# Patient Record
Sex: Female | Born: 1935 | Race: White | Hispanic: No | Smoking: Never smoker
Health system: Southern US, Community
[De-identification: ages and names within clinical notes are randomized; demographics above are authoritative.]

## PROBLEM LIST (undated history)

## (undated) DIAGNOSIS — E079 Disorder of thyroid, unspecified: Secondary | ICD-10-CM

## (undated) DIAGNOSIS — I639 Cerebral infarction, unspecified: Secondary | ICD-10-CM

## (undated) DIAGNOSIS — R112 Nausea with vomiting, unspecified: Secondary | ICD-10-CM

## (undated) DIAGNOSIS — I10 Essential (primary) hypertension: Secondary | ICD-10-CM

## (undated) DIAGNOSIS — C801 Malignant (primary) neoplasm, unspecified: Secondary | ICD-10-CM

## (undated) DIAGNOSIS — R011 Cardiac murmur, unspecified: Secondary | ICD-10-CM

## (undated) DIAGNOSIS — E7800 Pure hypercholesterolemia, unspecified: Secondary | ICD-10-CM

## (undated) DIAGNOSIS — Z9889 Other specified postprocedural states: Secondary | ICD-10-CM

## (undated) DIAGNOSIS — E039 Hypothyroidism, unspecified: Secondary | ICD-10-CM

## (undated) DIAGNOSIS — R42 Dizziness and giddiness: Secondary | ICD-10-CM

## (undated) HISTORY — PX: APPENDECTOMY: SHX54

## (undated) HISTORY — PX: EYE SURGERY: SHX253

## (undated) HISTORY — DX: Cardiac murmur, unspecified: R01.1

## (undated) HISTORY — PX: HEMORROIDECTOMY: SUR656

## (undated) HISTORY — DX: Malignant (primary) neoplasm, unspecified: C80.1

## (undated) HISTORY — PX: ABDOMINAL HYSTERECTOMY: SHX81

## (undated) HISTORY — PX: OTHER SURGICAL HISTORY: SHX169

## (undated) HISTORY — PX: CHOLECYSTECTOMY: SHX55

## (undated) HISTORY — PX: MASTECTOMY: SHX3

---

## 2004-08-17 ENCOUNTER — Ambulatory Visit: Payer: Self-pay | Admitting: General Surgery

## 2005-06-21 ENCOUNTER — Ambulatory Visit: Payer: Self-pay | Admitting: Unknown Physician Specialty

## 2005-09-03 ENCOUNTER — Ambulatory Visit: Payer: Self-pay | Admitting: General Surgery

## 2006-11-08 ENCOUNTER — Ambulatory Visit: Payer: Self-pay | Admitting: General Surgery

## 2006-12-26 ENCOUNTER — Ambulatory Visit: Payer: Self-pay | Admitting: Internal Medicine

## 2007-10-13 ENCOUNTER — Ambulatory Visit: Payer: Self-pay | Admitting: Internal Medicine

## 2007-11-10 ENCOUNTER — Ambulatory Visit: Payer: Self-pay | Admitting: Surgery

## 2008-11-15 ENCOUNTER — Ambulatory Visit: Payer: Self-pay | Admitting: Surgery

## 2009-05-16 ENCOUNTER — Emergency Department: Payer: Self-pay | Admitting: Emergency Medicine

## 2009-11-20 ENCOUNTER — Ambulatory Visit: Payer: Self-pay | Admitting: Surgery

## 2010-04-15 ENCOUNTER — Ambulatory Visit: Payer: Self-pay | Admitting: Ophthalmology

## 2010-04-22 ENCOUNTER — Ambulatory Visit: Payer: Self-pay | Admitting: Ophthalmology

## 2010-11-23 ENCOUNTER — Ambulatory Visit: Payer: Self-pay | Admitting: Surgery

## 2011-01-04 ENCOUNTER — Encounter: Payer: Self-pay | Admitting: Internal Medicine

## 2011-01-09 ENCOUNTER — Encounter: Payer: Self-pay | Admitting: Internal Medicine

## 2011-02-09 ENCOUNTER — Encounter: Payer: Self-pay | Admitting: Internal Medicine

## 2011-03-12 ENCOUNTER — Encounter: Payer: Self-pay | Admitting: Internal Medicine

## 2011-04-02 ENCOUNTER — Ambulatory Visit: Payer: Self-pay | Admitting: Neurology

## 2011-04-10 ENCOUNTER — Emergency Department: Payer: Self-pay | Admitting: Emergency Medicine

## 2011-04-21 ENCOUNTER — Other Ambulatory Visit: Payer: Self-pay | Admitting: Neurosurgery

## 2011-04-24 ENCOUNTER — Encounter (HOSPITAL_COMMUNITY): Payer: Self-pay | Admitting: Neurology

## 2011-04-24 ENCOUNTER — Inpatient Hospital Stay (HOSPITAL_COMMUNITY)
Admission: EM | Admit: 2011-04-24 | Discharge: 2011-04-30 | DRG: 057 | Disposition: A | Discharge status: Skilled Nursing Facility | Payer: Medicare Other | Source: Ambulatory Visit | Attending: Neurosurgery | Admitting: Neurosurgery

## 2011-04-24 ENCOUNTER — Other Ambulatory Visit: Payer: Self-pay

## 2011-04-24 DIAGNOSIS — IMO0002 Reserved for concepts with insufficient information to code with codable children: Secondary | ICD-10-CM

## 2011-04-24 DIAGNOSIS — G912 (Idiopathic) normal pressure hydrocephalus: Principal | ICD-10-CM | POA: Diagnosis present

## 2011-04-24 DIAGNOSIS — K5900 Constipation, unspecified: Secondary | ICD-10-CM | POA: Diagnosis present

## 2011-04-24 DIAGNOSIS — E7800 Pure hypercholesterolemia, unspecified: Secondary | ICD-10-CM | POA: Diagnosis present

## 2011-04-24 DIAGNOSIS — E039 Hypothyroidism, unspecified: Secondary | ICD-10-CM | POA: Diagnosis present

## 2011-04-24 DIAGNOSIS — R29898 Other symptoms and signs involving the musculoskeletal system: Secondary | ICD-10-CM | POA: Diagnosis present

## 2011-04-24 DIAGNOSIS — Z79899 Other long term (current) drug therapy: Secondary | ICD-10-CM

## 2011-04-24 DIAGNOSIS — R269 Unspecified abnormalities of gait and mobility: Secondary | ICD-10-CM | POA: Diagnosis present

## 2011-04-24 DIAGNOSIS — I059 Rheumatic mitral valve disease, unspecified: Secondary | ICD-10-CM | POA: Diagnosis present

## 2011-04-24 DIAGNOSIS — R4189 Other symptoms and signs involving cognitive functions and awareness: Secondary | ICD-10-CM | POA: Diagnosis present

## 2011-04-24 DIAGNOSIS — I10 Essential (primary) hypertension: Secondary | ICD-10-CM | POA: Diagnosis present

## 2011-04-24 DIAGNOSIS — N3942 Incontinence without sensory awareness: Secondary | ICD-10-CM | POA: Diagnosis present

## 2011-04-24 DIAGNOSIS — R4689 Other symptoms and signs involving appearance and behavior: Secondary | ICD-10-CM | POA: Diagnosis present

## 2011-04-24 DIAGNOSIS — Z9849 Cataract extraction status, unspecified eye: Secondary | ICD-10-CM

## 2011-04-24 DIAGNOSIS — E785 Hyperlipidemia, unspecified: Secondary | ICD-10-CM | POA: Diagnosis present

## 2011-04-24 DIAGNOSIS — H409 Unspecified glaucoma: Secondary | ICD-10-CM | POA: Diagnosis present

## 2011-04-24 DIAGNOSIS — Z9181 History of falling: Secondary | ICD-10-CM

## 2011-04-24 DIAGNOSIS — G2581 Restless legs syndrome: Secondary | ICD-10-CM | POA: Diagnosis present

## 2011-04-24 HISTORY — DX: Disorder of thyroid, unspecified: E07.9

## 2011-04-24 HISTORY — DX: Dizziness and giddiness: R42

## 2011-04-24 HISTORY — DX: Pure hypercholesterolemia, unspecified: E78.00

## 2011-04-24 HISTORY — DX: Essential (primary) hypertension: I10

## 2011-04-24 LAB — BASIC METABOLIC PANEL
BUN: 13 mg/dL (ref 6–23)
CO2: 26 mEq/L (ref 19–32)
Chloride: 100 mEq/L (ref 96–112)
Creatinine, Ser: 0.54 mg/dL (ref 0.50–1.10)
GFR calc Af Amer: >90 mL/min (ref >90)
Glucose, Bld: 92 mg/dL (ref 70–99)
Potassium: 4.3 mEq/L (ref 3.5–5.1)

## 2011-04-24 LAB — CBC
HCT: 38.8 % (ref 36.0–46.0)
Hemoglobin: 13.7 g/dL (ref 12.0–15.0)
RBC: 4.25 MIL/uL (ref 3.87–5.11)

## 2011-04-24 LAB — DIFFERENTIAL
Lymphs Abs: 1.5 10*3/uL (ref 0.7–4.0)
Monocytes Absolute: 0.7 10*3/uL (ref 0.1–1.0)
Monocytes Relative: 7 % (ref 3–12)
Neutro Abs: 8.1 10*3/uL — ABNORMAL HIGH (ref 1.7–7.7)
Neutrophils Relative %: 77 % (ref 43–77)

## 2011-04-24 LAB — URINALYSIS, ROUTINE W REFLEX MICROSCOPIC
Bilirubin Urine: NEGATIVE
Glucose, UA: NEGATIVE mg/dL
Hgb urine dipstick: NEGATIVE
Nitrite: NEGATIVE
Specific Gravity, Urine: 1.009 (ref 1.005–1.030)
pH: 6 (ref 5.0–8.0)

## 2011-04-24 MED ORDER — POLYETHYLENE GLYCOL 3350 17 G PO PACK
17.0000 g | PACK | Freq: Every day | ORAL | Status: DC | PRN
  Filled 2011-04-24: qty 1

## 2011-04-24 MED ORDER — ONDANSETRON HCL 4 MG PO TABS: 4.0000 mg | ORAL_TABLET | Freq: Four times a day (QID) | ORAL | Status: DC | PRN

## 2011-04-24 MED ORDER — LISINOPRIL 20 MG PO TABS
20.0000 mg | ORAL_TABLET | Freq: Every day | ORAL | Status: DC
  Administered 2011-04-25 – 2011-04-30 (×6): 20 mg via ORAL
  Filled 2011-04-24 (×7): qty 1

## 2011-04-24 MED ORDER — ACETAMINOPHEN 325 MG PO TABS: 650.0000 mg | ORAL_TABLET | Freq: Four times a day (QID) | ORAL | Status: DC | PRN

## 2011-04-24 MED ORDER — TIMOLOL MALEATE 0.25 % OP SOLN
1.0000 [drp] | Freq: Every day | OPHTHALMIC | Status: DC
  Administered 2011-04-25: 1 [drp] via OPHTHALMIC
  Administered 2011-04-26: 2 [drp] via OPHTHALMIC
  Administered 2011-04-27 – 2011-04-28 (×2): 1 [drp] via OPHTHALMIC
  Administered 2011-04-29: 2 [drp] via OPHTHALMIC
  Administered 2011-04-30: 1 [drp] via OPHTHALMIC
  Filled 2011-04-24 (×2): qty 5

## 2011-04-24 MED ORDER — TIMOLOL HEMIHYDRATE 0.25 % OP SOLN: 1.0000 [drp] | Freq: Every day | OPHTHALMIC | Status: DC

## 2011-04-24 MED ORDER — LEVOTHYROXINE SODIUM 75 MCG PO TABS
75.0000 ug | ORAL_TABLET | Freq: Every day | ORAL | Status: DC
  Administered 2011-04-25 – 2011-04-30 (×6): 75 ug via ORAL
  Filled 2011-04-24 (×7): qty 1

## 2011-04-24 MED ORDER — BIMATOPROST 0.03 % OP SOLN
1.0000 [drp] | Freq: Every day | OPHTHALMIC | Status: DC
  Filled 2011-04-24: qty 2.5

## 2011-04-24 MED ORDER — BIMATOPROST 0.01 % OP SOLN
1.0000 [drp] | Freq: Every day | OPHTHALMIC | Status: DC
  Administered 2011-04-24 – 2011-04-29 (×6): 1 [drp] via OPHTHALMIC
  Filled 2011-04-24: qty 2.5

## 2011-04-24 MED ORDER — SODIUM CHLORIDE 0.9 % IV SOLN: INTRAVENOUS | Status: DC

## 2011-04-24 MED ORDER — HYDROCODONE-ACETAMINOPHEN 5-325 MG PO TABS: 1.0000 | ORAL_TABLET | ORAL | Status: DC | PRN

## 2011-04-24 MED ORDER — ZOLPIDEM TARTRATE 5 MG PO TABS: 5.0000 mg | ORAL_TABLET | Freq: Every evening | ORAL | Status: DC | PRN

## 2011-04-24 MED ORDER — LISINOPRIL 20 MG PO TABS
20.0000 mg | ORAL_TABLET | Freq: Every day | ORAL | Status: DC
Start: 2011-04-24 — End: 2011-04-24
  Filled 2011-04-24: qty 1

## 2011-04-24 MED ORDER — ACETAMINOPHEN 650 MG RE SUPP: 650.0000 mg | Freq: Four times a day (QID) | RECTAL | Status: DC | PRN

## 2011-04-24 MED ORDER — DOCUSATE SODIUM 100 MG PO CAPS
100.0000 mg | ORAL_CAPSULE | Freq: Two times a day (BID) | ORAL | Status: DC
  Administered 2011-04-25 – 2011-04-30 (×8): 100 mg via ORAL
  Filled 2011-04-24 (×10): qty 1

## 2011-04-24 MED ORDER — ONDANSETRON HCL 4 MG/2ML IJ SOLN: 4.0000 mg | Freq: Four times a day (QID) | INTRAMUSCULAR | Status: DC | PRN

## 2011-04-24 MED ORDER — ALUM & MAG HYDROXIDE-SIMETH 200-200-20 MG/5ML PO SUSP: 30.0000 mL | Freq: Four times a day (QID) | ORAL | Status: DC | PRN

## 2011-04-24 NOTE — ED Notes (Signed)
Attempt to call report, RN speaking with house coverage regarding pt bed placement

## 2011-04-24 NOTE — ED Notes (Signed)
Pt daughter to bedside, reporting over past 2 weeks pt has had a change in her health beginning with a fall. Pt mobility has decreased to where pt needing walker to ambulate. Pt lived independently prior to fall until now living with daughter. Daughter reporting pt has been having "cognitive slips". Having difficulty with articulation, basic tasks such as writing checks. Pt has been having increasing urinary incontinence over past 2 weeks, bowel incontinence began today. EDP at bedside

## 2011-04-24 NOTE — H&P (Signed)
PCP:   No primary provider on file.   Chief Complaint:  Unsteady gait, weakness, urinary incontinence progressive  HPI: This is a 78 yo woman with PMH sig for HTN and Glaucoma who up until about 6 months ago was fully independent, highly functioning elderly woman-she has had a rapid progression of gait disturbance, urinary incontinence, and cognitive/memory changes. She has been worked up by a neurologist in [place] who was presuming a diagnosis of normal pressure hydrocephalus give triad of symptoms however she has never had an LP done. MRI images done in [place]. She came in with her daughter because the weakness became so bad that she could not be managed at home without full assist and help.  Allergies:  No Known Allergies    Past Medical History  Diagnosis Date  . Hypertension   . Hypercholesteremia   . Thyroid disease   . Vertigo     Past Surgical History  Procedure Date  . Cholecystectomy   . Appendectomy   . Mastectomy   . Abdominal hysterectomy   . Hemorroidectomy     Prior to Admission medications   Medication Sig Start Date End Date Taking? Authorizing Provider  bimatoprost (LUMIGAN) 0.03 % ophthalmic solution Place 1 drop into both eyes at bedtime.   Yes Historical Provider, MD  levothyroxine (SYNTHROID, LEVOTHROID) 75 MCG tablet Take 75 mcg by mouth daily.   Yes Historical Provider, MD  lisinopril (PRINIVIL,ZESTRIL) 20 MG tablet Take 20 mg by mouth daily.   Yes Historical Provider, MD  loratadine (CLARITIN) 10 MG tablet Take 10 mg by mouth daily.   Yes Historical Provider, MD  lovastatin (MEVACOR) 20 MG tablet Take 20 mg by mouth at bedtime.   Yes Historical Provider, MD  timolol (BETIMOL) 0.25 % ophthalmic solution Place 1-2 drops into the right eye daily.   Yes Historical Provider, MD  VITAMIN D, ERGOCALCIFEROL, PO Take 1,000 Units by mouth daily.   Yes Historical Provider, MD    Social History:  reports that she has never smoked. She does not have any  smokeless tobacco history on file. She reports that she does not drink alcohol or use illicit drugs.  No family history on file.  Review of Systems:  Constitutional: Denies fever, chills, diaphoresis, appetite change and fatigue.  HEENT: Denies photophobia, eye pain, redness, hearing loss, ear pain, congestion, sore throat, rhinorrhea, sneezing, mouth sores, trouble swallowing, neck pain, neck stiffness and tinnitus.   Respiratory: Denies SOB, DOE, cough, chest tightness,  and wheezing.   Cardiovascular: Denies chest pain, palpitations and leg swelling.  Gastrointestinal: Denies nausea, vomiting, abdominal pain, diarrhea, constipation, blood in stool and abdominal distention.  Genitourinary: frequency and lack of control of flow Musculoskeletal: Denies myalgias, back pain, joint swelling, arthralgias  Skin: Denies pallor, rash and wound.  Neurological: Denies dizziness, seizures, syncope, weakness, light-headedness, numbness and headaches.  Hematological: Denies adenopathy. Easy bruising, personal or family bleeding history  Psychiatric/Behavioral: Denies suicidal ideation.  Physical Exam: Blood pressure 121/73, pulse 99, temperature 98 F (36.7 C), temperature source Oral, resp. rate 16, weight 64.093 kg (141 lb 4.8 oz), SpO2 93.00%. General appearance: NAD, conversant  Eyes: anicteric sclerae, moist conjunctivae; no lid-lag; PERRLA HENT: Atraumatic; oropharynx clear with moist mucous membranes and no mucosal ulcerations; normal hard and soft palate Neck: Trachea midline; FROM, supple, no thyromegaly or lymphadenopathy Lungs: CTA, with normal respiratory effort and no intercostal retractions CV: RRR, no MRGs  Abdomen: Soft, non-tender; no masses or HSM Extremities: No peripheral edema or extremity lymphadenopathy  Skin: Normal temperature, turgor and texture; no rash, ulcers or subcutaneous nodules Psych: Appropriate affect, alert and oriented to person, place and time Neuro; reflexes  in LE 1+, very unsteady, but strength preserved on flexion/dorsiflexion LE bilaterlally, no muscle atrophy   Labs on Admission:  Results for orders placed during the hospital encounter of 04/24/11 (from the past 48 hour(s))  CBC     Status: Normal   Collection Time   04/24/11  2:22 PM      Component Value Range Comment   WBC 10.5  4.0 - 10.5 (K/uL)    RBC 4.25  3.87 - 5.11 (MIL/uL)    Hemoglobin 13.7  12.0 - 15.0 (g/dL)    HCT 16.1  09.6 - 04.5 (%)    MCV 91.3  78.0 - 100.0 (fL)    MCH 32.2  26.0 - 34.0 (pg)    MCHC 35.3  30.0 - 36.0 (g/dL)    RDW 40.9  81.1 - 91.4 (%)    Platelets 395  150 - 400 (K/uL)   DIFFERENTIAL     Status: Abnormal   Collection Time   04/24/11  2:22 PM      Component Value Range Comment   Neutrophils Relative 77  43 - 77 (%)    Neutro Abs 8.1 (*) 1.7 - 7.7 (K/uL)    Lymphocytes Relative 14  12 - 46 (%)    Lymphs Abs 1.5  0.7 - 4.0 (K/uL)    Monocytes Relative 7  3 - 12 (%)    Monocytes Absolute 0.7  0.1 - 1.0 (K/uL)    Eosinophils Relative 2  0 - 5 (%)    Eosinophils Absolute 0.2  0.0 - 0.7 (K/uL)    Basophils Relative 0  0 - 1 (%)    Basophils Absolute 0.0  0.0 - 0.1 (K/uL)   BASIC METABOLIC PANEL     Status: Abnormal   Collection Time   04/24/11  2:22 PM      Component Value Range Comment   Sodium 136  135 - 145 (mEq/L)    Potassium 4.3  3.5 - 5.1 (mEq/L)    Chloride 100  96 - 112 (mEq/L)    CO2 26  19 - 32 (mEq/L)    Glucose, Bld 92  70 - 99 (mg/dL)    BUN 13  6 - 23 (mg/dL)    Creatinine, Ser 7.82  0.50 - 1.10 (mg/dL)    Calcium 9.5  8.4 - 10.5 (mg/dL)    GFR calc non Af Amer 87 (*) >90 (mL/min)    GFR calc Af Amer >90  >90 (mL/min)   URINALYSIS, ROUTINE W REFLEX MICROSCOPIC     Status: Normal   Collection Time   04/24/11  2:33 PM      Component Value Range Comment   Color, Urine YELLOW  YELLOW     APPearance CLEAR  CLEAR     Specific Gravity, Urine 1.009  1.005 - 1.030     pH 6.0  5.0 - 8.0     Glucose, UA NEGATIVE  NEGATIVE (mg/dL)     Hgb urine dipstick NEGATIVE  NEGATIVE     Bilirubin Urine NEGATIVE  NEGATIVE     Ketones, ur NEGATIVE  NEGATIVE (mg/dL)    Protein, ur NEGATIVE  NEGATIVE (mg/dL)    Urobilinogen, UA 0.2  0.0 - 1.0 (mg/dL)    Nitrite NEGATIVE  NEGATIVE     Leukocytes, UA NEGATIVE  NEGATIVE  MICROSCOPIC NOT DONE ON URINES WITH  NEGATIVE PROTEIN, BLOOD, LEUKOCYTES, NITRITE, OR GLUCOSE <1000 mg/dL.  SEDIMENTATION RATE     Status: Abnormal   Collection Time   04/24/11  6:17 PM      Component Value Range Comment   Sed Rate 26 (*) 0 - 22 (mm/hr)     Radiological Exams on Admission: No results found.  Assessment/Plan Principal Problem:  *Weakness of both legs Active Problems:  NPH (normal pressure hydrocephalus)  Urinary incontinence without sensory awareness  Gait abnormality  Cognitive and behavioral changes  Hypertension  Hyperlipidemia  Glaucoma  1. Weakness/Gait/Incontinence I strongly suspect NPH given gait problems, urinary incontinence, and cognitive changes acute in onset. Need to obtain images from [facility] and neuro records. Will get LP either by IR or procedures team ASAP and get a opening pressure. May need lumbar shunt/drain if elevated. Neuro following.  2. HTN: will resume home medications  3. Glaucoma: resume home eye gtts  4. HLD; I opted to stop her statin, she isn't having myalgias, but since there is LE weakness will hold until further charterized   Time Spent on Admission: 60 min  [facility] Pager: [phone] 04/24/2011, 10:22 PM

## 2011-04-24 NOTE — Progress Notes (Signed)
Linda Davila [id number] Code Status: FULL   Admission Data: 04/24/2011 10:27 PM Attending Provider:  Laqueta Linden. PCP:No primary provider on file. Consults/ Treatment Team:    [name] is a 78 y.o. female patient admitted from ED awake, alert - oriented  X 3 - no acute distress noted.  VSS - Blood pressure 121/73, pulse 99, temperature 98 F (36.7 C), temperature source Oral, resp. rate 16, weight 64.093 kg (141 lb 4.8 oz), SpO2 93.00%.  no c/o shortness of breath, no c/o chest pain.  IV Fluids:  IV in place, occlusive dsg intact without redness, IV cath wrist left, condition patent and no redness normal saline.  Allergies:  No Known Allergies   Past Medical History  Diagnosis Date  . Hypertension   . Hypercholesteremia   . Thyroid disease   . Vertigo    Medications Prior to Admission  Medication Dose Route Frequency Provider Last Rate Last Dose  . 0.9 %  sodium chloride infusion   Intravenous Continuous Edsel Petrin, DO      . acetaminophen (TYLENOL) tablet 650 mg  650 mg Oral Q6H PRN Edsel Petrin, DO       Or  . acetaminophen (TYLENOL) suppository 650 mg  650 mg Rectal Q6H PRN Edsel Petrin, DO      . alum & mag hydroxide-simeth (MAALOX/MYLANTA) 200-200-20 MG/5ML suspension 30 mL  30 mL Oral Q6H PRN Edsel Petrin, DO      . bimatoprost (LUMIGAN) 0.01 % ophthalmic solution 1 drop  1 drop Both Eyes QHS Edsel Petrin, DO   1 drop at 04/24/11 2118  . docusate sodium (COLACE) capsule 100 mg  100 mg Oral BID Edsel Petrin, DO      . HYDROcodone-acetaminophen (NORCO) 5-325 MG per tablet 1-2 tablet  1-2 tablet Oral Q4H PRN Edsel Petrin, DO      . levothyroxine (SYNTHROID, LEVOTHROID) tablet 75 mcg  75 mcg Oral Q0600 Edsel Petrin, DO      . lisinopril (PRINIVIL,ZESTRIL) tablet 20 mg  20 mg Oral Daily Edsel Petrin, DO      . ondansetron [facility]) tablet 4 mg  4 mg Oral Q6H PRN Edsel Petrin, DO       Or  . ondansetron  [facility]) injection 4 mg  4 mg Intravenous Q6H PRN Edsel Petrin, DO      . polyethylene glycol (MIRALAX / GLYCOLAX) packet 17 g  17 g Oral Daily PRN Edsel Petrin, DO      . timolol (TIMOPTIC) 0.25 % ophthalmic solution 1-2 drop  1-2 drop Right Eye Daily Edsel Petrin, DO      . zolpidem (AMBIEN) tablet 5 mg  5 mg Oral QHS PRN Edsel Petrin, DO      . DISCONTD: bimatoprost (LUMIGAN) 0.03 % ophthalmic solution 1 drop  1 drop Both Eyes QHS Edsel Petrin, DO      . DISCONTD: lisinopril (PRINIVIL,ZESTRIL) tablet 20 mg  20 mg Oral Daily Edsel Petrin, DO      . DISCONTD: timolol (BETIMOL) 0.25 % ophthalmic solution 1-2 drop  1-2 drop Right Eye Daily Edsel Petrin, DO       No current outpatient prescriptions on file as of 04/24/2011.   History:  obtained from the patient. Tobacco/alcohol: denied none  Orientation to room, and floor completed with information packet given to patient/family.  Patient declined safety video at this time.  Admission INP armband ID verified with patient/family,  and in place.   SR up x 2, fall assessment complete, with patient and family able to verbalize understanding of risk associated with falls, and verbalized understanding to call nsg before up out of bed.  Call light within reach, patient able to voice, and demonstrate understanding.  Skin, clean-dry- intact without evidence of bruising, or skin tears.   No evidence of skin break down noted on exam.     Will cont to eval and treat per MD orders.  [name], RN 04/24/2011 10:27 PM

## 2011-04-24 NOTE — Consult Note (Signed)
Reason for Consult: "normal pressure hydrocephalus"  HPI: Linda Davila is an 78 y.o. female who was sent in for an evaluation of normal pressure hydrocephalus. Per report, she had a normal outside MRI that was consistent with NPH. The images are not available for review at this time. She has had 3 falls in the last 2 weeks and has had some urinary frequency. Overall, she has had trouble with gait for the last 6 months.   Past Medical History  Diagnosis Date  . Hypertension   . Hypercholesteremia   . Thyroid disease   . Vertigo    Medications: I have reviewed the patient's current medications.  Past Surgical History  Procedure Date  . Cholecystectomy   . Appendectomy   . Mastectomy   . Abdominal hysterectomy   . Hemorroidectomy    No family history on file.  Social History:  reports that she has never smoked. She does not have any smokeless tobacco history on file. She reports that she does not drink alcohol or use illicit drugs.  Allergies: No Known Allergies  ROS: as above  Blood pressure 155/81, pulse 83, temperature 98.3 F (36.8 C), temperature source Oral, resp. rate 15, SpO2 98.00%.  Neurological exam: AAO*3. No aphasia.  Was able to tell me months of the year forwards and backwards correctly, exhibiting good attention span. Recall was 3 of 3 after 5 minutes. Followed complex commands. Cranial nerves: EOMI, PERRL. Visual fields were full. Sensation to V1 through V3 areas of the face was intact and symmetric throughout. There was no facial asymmetry. Hearing to finger rub was equal and symmetrical bilaterally. Shoulder shrug was 5/5 and symmetric bilaterally. Head rotation was 5/5 bilaterally. There was no dysarthria or palatal deviation. Motor: strength was 5/5 and symmetric throughout. Sensory: was intact throughout to light touch, pinprick. Coordination: finger-to-nose were intact and symmetric bilaterally. Reflexes: were 2+ in upper extremities and 1+ at the knees and 1+ at  the ankles. Plantar response was downgoing bilaterally. Gait: Romberg test was positive. Not able to stand without support and would fall backwards.   Results for orders placed during the hospital encounter of 04/24/11 (from the past 48 hour(s))  CBC     Status: Normal   Collection Time   04/24/11  2:22 PM      Component Value Range Comment   WBC 10.5  4.0 - 10.5 (K/uL)    RBC 4.25  3.87 - 5.11 (MIL/uL)    Hemoglobin 13.7  12.0 - 15.0 (g/dL)    HCT 09.8  11.9 - 14.7 (%)    MCV 91.3  78.0 - 100.0 (fL)    MCH 32.2  26.0 - 34.0 (pg)    MCHC 35.3  30.0 - 36.0 (g/dL)    RDW 82.9  56.2 - 13.0 (%)    Platelets 395  150 - 400 (K/uL)   DIFFERENTIAL     Status: Abnormal   Collection Time   04/24/11  2:22 PM      Component Value Range Comment   Neutrophils Relative 77  43 - 77 (%)    Neutro Abs 8.1 (*) 1.7 - 7.7 (K/uL)    Lymphocytes Relative 14  12 - 46 (%)    Lymphs Abs 1.5  0.7 - 4.0 (K/uL)    Monocytes Relative 7  3 - 12 (%)    Monocytes Absolute 0.7  0.1 - 1.0 (K/uL)    Eosinophils Relative 2  0 - 5 (%)    Eosinophils Absolute 0.2  0.0 - 0.7 (K/uL)    Basophils Relative 0  0 - 1 (%)    Basophils Absolute 0.0  0.0 - 0.1 (K/uL)   BASIC METABOLIC PANEL     Status: Abnormal   Collection Time   04/24/11  2:22 PM      Component Value Range Comment   Sodium 136  135 - 145 (mEq/L)    Potassium 4.3  3.5 - 5.1 (mEq/L)    Chloride 100  96 - 112 (mEq/L)    CO2 26  19 - 32 (mEq/L)    Glucose, Bld 92  70 - 99 (mg/dL)    BUN 13  6 - 23 (mg/dL)    Creatinine, Ser 1.61  0.50 - 1.10 (mg/dL)    Calcium 9.5  8.4 - 10.5 (mg/dL)    GFR calc non Af Amer 87 (*) >90 (mL/min)    GFR calc Af Amer >90  >90 (mL/min)   URINALYSIS, ROUTINE W REFLEX MICROSCOPIC     Status: Normal   Collection Time   04/24/11  2:33 PM      Component Value Range Comment   Color, Urine YELLOW  YELLOW     APPearance CLEAR  CLEAR     Specific Gravity, Urine 1.009  1.005 - 1.030     pH 6.0  5.0 - 8.0     Glucose, UA NEGATIVE   NEGATIVE (mg/dL)    Hgb urine dipstick NEGATIVE  NEGATIVE     Bilirubin Urine NEGATIVE  NEGATIVE     Ketones, ur NEGATIVE  NEGATIVE (mg/dL)    Protein, ur NEGATIVE  NEGATIVE (mg/dL)    Urobilinogen, UA 0.2  0.0 - 1.0 (mg/dL)    Nitrite NEGATIVE  NEGATIVE     Leukocytes, UA NEGATIVE  NEGATIVE  MICROSCOPIC NOT DONE ON URINES WITH NEGATIVE PROTEIN, BLOOD, LEUKOCYTES, NITRITE, OR GLUCOSE <1000 mg/dL.   Assessment/Plan: 78 years old woman with suspected normal pressure hydrocephalus and multiple recent falls who was sent in by her neurologist for an evaluation 1) Recommend obtaining the films done at her neurologist's office and reviewing them with radiology to confirm the diagnosis 2) Can LP for opening pressure as well as cell count, WBC and RBC counts  Linda Davila 04/24/2011, 5:08 PM

## 2011-04-24 NOTE — ED Notes (Signed)
Per ems- ems called out today for new bowel incontinence and weakness. PT being evaluated on Friday for NPH, getting spinal tap on Friday for further investigation. Denying any pain. A & O x 4. Pt denying any vomiting. 164/102, 92 SR. NAD

## 2011-04-24 NOTE — ED Notes (Signed)
Pt reporting a fall 2 weeks ago, since then been feeling weak and having to walk with walker vs just using a cane.

## 2011-04-24 NOTE — ED Provider Notes (Addendum)
History     CSN: [id number]  Arrival date & time 04/24/11  1247   First MD Initiated Contact with Patient 04/24/11 1251      Chief Complaint  Patient presents with  . Weakness  . Encopresis    (Consider location/radiation/quality/duration/timing/severity/associated sxs/prior treatment) Patient is a 78 y.o. female presenting with weakness. The history is provided by the patient and a relative.  Weakness Primary symptoms do not include headaches, fever, nausea or vomiting.  Additional symptoms include weakness.   the patient is a 78 year old, female, who has normal pressure hydrocephalus, who presents to emergency department after she had an episode of weakness in her legs, which cause her fall onto the toilet.  She was taking a shower, and tried to get out.  Her legs became weak, and she collapsed onto the toilet where she had a loose stool.  Following that.  She called the medics and was brought to the emergency department for evaluation.  She has a neurologist in [place], who has been evaluating her for frequent falls and confusion.  MRI has been performed, and she has been diagnosed with a normal pressure hydrocephalus.  She has also been evaluated by Dr. Georgia Duff the neurosurgeon.  She denies pain at this time.  She denies recent illness.  She has not had nausea, vomiting, fevers, chills, or urinary tract symptoms.  She denies a history of heart attack or stroke.  She does not have diabetes.  She does not cigarettes.  Past Medical History  Diagnosis Date  . Hypertension   . Hypercholesteremia   . Thyroid disease   . Vertigo     Past Surgical History  Procedure Date  . Cholecystectomy   . Appendectomy   . Mastectomy   . Abdominal hysterectomy   . Hemorroidectomy     No family history on file.  History  Substance Use Topics  . Smoking status: Never Smoker   . Smokeless tobacco: Not on file  . Alcohol Use: No    OB History    Grav Para Term Preterm Abortions TAB  SAB Ect Mult Living                  Review of Systems  Constitutional: Negative for fever and chills.  Eyes: Negative for visual disturbance.  Respiratory: Negative for cough, chest tightness and shortness of breath.   Cardiovascular: Negative for chest pain and leg swelling.  Gastrointestinal: Positive for diarrhea. Negative for nausea, vomiting and abdominal pain.  Genitourinary: Negative for dysuria.  Musculoskeletal: Negative for back pain.  Neurological: Positive for weakness. Negative for syncope and headaches.  Psychiatric/Behavioral: Negative for confusion.  All other systems reviewed and are negative.    Allergies  Review of patient's allergies indicates no known allergies.  Home Medications   Current Outpatient Rx  Name Route Sig Dispense Refill  . BIMATOPROST 0.03 % OP SOLN Both Eyes Place 1 drop into both eyes at bedtime.    Marland Kitchen LEVOTHYROXINE SODIUM 75 MCG PO TABS Oral Take 75 mcg by mouth daily.    Marland Kitchen LISINOPRIL 20 MG PO TABS Oral Take 20 mg by mouth daily.    Marland Kitchen LORATADINE 10 MG PO TABS Oral Take 10 mg by mouth daily.    Marland Kitchen LOVASTATIN 20 MG PO TABS Oral Take 20 mg by mouth at bedtime.    Marland Kitchen TIMOLOL HEMIHYDRATE 0.25 % OP SOLN Right Eye Place 1-2 drops into the right eye daily.    Marland Kitchen VITAMIN D (ERGOCALCIFEROL) PO Oral  Take 1,000 Units by mouth daily.      BP 159/78  Pulse 91  Temp(Src) 98.3 F (36.8 C) (Oral)  Resp 23  SpO2 90%  Physical Exam  Vitals reviewed. Constitutional: She is oriented to person, place, and time. She appears well-developed and well-nourished.  HENT:  Head: Normocephalic and atraumatic.  Eyes: Pupils are equal, round, and reactive to light.  Neck: Normal range of motion.  Cardiovascular: Normal rate, regular rhythm and normal heart sounds.   No murmur heard. Pulmonary/Chest: Effort normal and breath sounds normal. No respiratory distress. She has no wheezes. She has no rales.  Abdominal: Soft. She exhibits no distension and no mass.  There is no tenderness. There is no rebound and no guarding.  Genitourinary:       Normal rectal tone.  Rectal examination was performed with a nurse chaperone  Musculoskeletal: Normal range of motion. She exhibits no edema and no tenderness.  Neurological: She is alert and oriented to person, place, and time. She displays normal reflexes. No cranial nerve deficit. She exhibits normal muscle tone.  Skin: Skin is warm and dry. No rash noted. No erythema.  Psychiatric: She has a normal mood and affect. Her behavior is normal.    ED Course  Procedures (including critical care time)   Labs Reviewed  CBC  DIFFERENTIAL  BASIC METABOLIC PANEL  URINALYSIS, ROUTINE W REFLEX MICROSCOPIC   No results found.   No diagnosis found.  3:39 PM Spoke with Dr. Lyman Speller.  Neurology. He will consult. Spoke with teaching service. They will admit  ED ECG REPORT   Date: 04/24/2011  EKG Time: 3:42 PM  Rate: 92  Rhythm: normal sinus rhythm,    Axis: left  Intervals:left anterior fascicular block and incomplete RBBB  ST&T Change: nonspecific  Narrative Interpretation: nsr with incomplete rbbb and lafb             MDM  NPH. Recurrent falls.        Cheri Guppy, MD 04/24/11 1540  Cheri Guppy, MD 04/24/11 1544

## 2011-04-24 NOTE — ED Notes (Signed)
Admitting at bedside 

## 2011-04-25 ENCOUNTER — Inpatient Hospital Stay (HOSPITAL_COMMUNITY): Payer: Medicare Other

## 2011-04-25 LAB — COMPREHENSIVE METABOLIC PANEL
Albumin: 2.9 g/dL — ABNORMAL LOW (ref 3.5–5.2)
BUN: 12 mg/dL (ref 6–23)
Chloride: 102 mEq/L (ref 96–112)
Creatinine, Ser: 0.56 mg/dL (ref 0.50–1.10)
Total Bilirubin: 0.4 mg/dL (ref 0.3–1.2)

## 2011-04-25 LAB — CBC
HCT: 36.8 % (ref 36.0–46.0)
MCH: 30.9 pg (ref 26.0–34.0)
MCHC: 34 g/dL (ref 30.0–36.0)
MCV: 90.9 fL (ref 78.0–100.0)
RDW: 12.9 % (ref 11.5–15.5)
WBC: 9.1 10*3/uL (ref 4.0–10.5)

## 2011-04-25 LAB — VITAMIN B12: Vitamin B-12: 1014 pg/mL — ABNORMAL HIGH (ref 211–911)

## 2011-04-25 LAB — PROTIME-INR
INR: 0.98 (ref 0.00–1.49)
Prothrombin Time: 13.2 seconds (ref 11.6–15.2)

## 2011-04-25 MED ORDER — SIMVASTATIN 10 MG PO TABS
10.0000 mg | ORAL_TABLET | Freq: Every day | ORAL | Status: DC
  Administered 2011-04-25 – 2011-04-29 (×5): 10 mg via ORAL
  Filled 2011-04-25 (×7): qty 1

## 2011-04-25 NOTE — Progress Notes (Signed)
PATIENT DETAILS Name: Aubriana Eustice Age: 78 y.o. Sex: female Date of Birth: 06/06/1930 Admit Date: 04/24/2011 PCP:No primary provider on file.  Subjective: No major issues overnight. Unsteady Gait/Falls/Incontinence/cognitive disturbances are going on for at least 6 months, admitted for worsening gait disturbance causing falls. Has been seen by Neurologist in [place]-Dr Shah-thought to have NPH-has been seen by Dr Lajuana Matte consideration of drain, but has not had a LP yet.  Objective: Vital signs in last 24 hours: Filed Vitals:   04/24/11 1645 04/24/11 1830 04/24/11 2219 04/25/11 0554  BP: 155/81 158/74 121/73 118/70  Pulse: 83 86 99 83  Temp:   98 F (36.7 C) 98.4 F (36.9 C)  TempSrc:   Oral Oral  Resp: 15 16 16 16   Height:   5\' 6"  (1.676 m)   Weight:   64.093 kg (141 lb 4.8 oz)   SpO2: 98% 98% 93% 96%    Weight change:   Body mass index is 22.81 kg/(m^2).  Intake/Output from previous day:  Intake/Output Summary (Last 24 hours) at 04/25/11 1021 Last data filed at 04/25/11 0500  Gross per 24 hour  Intake    750 ml  Output      0 ml  Net    750 ml    PHYSICAL EXAM: Gen Exam: Awake and alert with clear speech.  Neck: Supple, No JVD.   Chest: B/L Clear.   CVS: S1 S2 Regular, no murmurs. Abdomen: soft, BS +, non tender, non distended.  Extremities: no edema, lower extremities warm to touch Neurologic: Non Focal.   Skin: No Rash.   Wounds: N/A.    CONSULTS:  neurology  LAB RESULTS: CBC  Lab 04/25/11 0558 04/24/11 1422  WBC 9.1 10.5  HGB 12.5 13.7  HCT 36.8 38.8  PLT 385 395  MCV 90.9 91.3  MCH 30.9 32.2  MCHC 34.0 35.3  RDW 12.9 13.0  LYMPHSABS -- 1.5  MONOABS -- 0.7  EOSABS -- 0.2  BASOSABS -- 0.0  BANDABS -- --    Chemistries   Lab 04/25/11 0558 04/24/11 1422  NA 136 136  K 4.0 4.3  CL 102 100  CO2 25 26  GLUCOSE 85 92  BUN 12 13  CREATININE 0.56 0.54  CALCIUM 9.0 9.5  MG -- --    GFR Estimated Creatinine Clearance: 53.4 ml/min  (by C-G formula based on Cr of 0.56).  Coagulation profile No results found for this basename: INR:5,PROTIME:5 in the last 168 hours  Cardiac Enzymes No results found for this basename: CK:3,CKMB:3,TROPONINI:3,MYOGLOBIN:3 in the last 168 hours  No components found with this basename: POCBNP:3 No results found for this basename: DDIMER:2 in the last 72 hours No results found for this basename: HGBA1C:2 in the last 72 hours No results found for this basename: CHOL:2,HDL:2,LDLCALC:2,TRIG:2,CHOLHDL:2,LDLDIRECT:2 in the last 72 hours  Basename 04/24/11 1817  TSH 5.559*  T4TOTAL --  T3FREE --  Marko Plume --    Basename 04/24/11 1817  VITAMINB12 1014*  FOLATE --  FERRITIN --  TIBC --  IRON --  RETICCTPCT --   No results found for this basename: LIPASE:2,AMYLASE:2 in the last 72 hours  Urine Studies No results found for this basename: UACOL:2,UAPR:2,USPG:2,UPH:2,UTP:2,UGL:2,UKET:2,UBIL:2,UHGB:2,UNIT:2,UROB:2,ULEU:2,UEPI:2,UWBC:2,URBC:2,UBAC:2,CAST:2,CRYS:2,UCOM:2,BILUA:2 in the last 72 hours  MICROBIOLOGY: No results found for this or any previous visit (from the past 240 hour(s)).  RADIOLOGY STUDIES/RESULTS: No results found.  MEDICATIONS: Scheduled Meds:   . bimatoprost  1 drop Both Eyes QHS  . docusate sodium  100 mg Oral BID  . levothyroxine  75 mcg Oral Q0600  . lisinopril  20 mg Oral Daily  . timolol  1-2 drop Right Eye Daily  . DISCONTD: bimatoprost  1 drop Both Eyes QHS  . DISCONTD: lisinopril  20 mg Oral Daily  . DISCONTD: timolol  1-2 drop Right Eye Daily   Continuous Infusions:   . DISCONTD: sodium chloride     PRN Meds:.acetaminophen, acetaminophen, alum & mag hydroxide-simeth, HYDROcodone-acetaminophen, ondansetron (ZOFRAN) IV, ondansetron, polyethylene glycol, zolpidem  Antibiotics: Anti-infectives    None      Assessment/Plan: Patient Active Hospital Problem List:  Lower Ext Weakness/Incontinence/Cognitive Dysfunction -suspicion for Normal  Pressure Hydrocephalus -worsening weakness, causing numerous falls recently, now not able to ambulate safely at all -has been evaluation by Neuro as outpatient-referred to Dr Venetia Maxon for drain placement-but has not had a LP yet -for now-will try to get a LP with documentation of opening pressure -will attempt to get radiologic studies from primary neurologist -get PT eval  HTN -controlled -continue with Lisinopril  Dyslipidemia -resume Statin  Hypothyroidism -continue with Levothyroxine  Glaucoma -Continue with Lumigan and Timolol  Disposition: -remain inpatient  DVT Prophylaxis: -B/L SCD's, start prophylactic lovenox once LP done  Code Status: Full Code  Maretta Bees,  MD. 04/25/2011, 10:21 AM

## 2011-04-26 ENCOUNTER — Inpatient Hospital Stay (HOSPITAL_COMMUNITY): Payer: Medicare Other

## 2011-04-26 LAB — IRON AND TIBC
Iron: 70 ug/dL (ref 42–135)
Saturation Ratios: 28 % (ref 20–55)
Saturation Ratios: 25 % (ref 20–55)

## 2011-04-26 LAB — RETICULOCYTES: Retic Count, Absolute: 52.8 10*3/uL (ref 19.0–186.0)

## 2011-04-26 NOTE — Progress Notes (Signed)
PATIENT DETAILS Name: Linda Davila Age: 78 y.o. Sex: female Date of Birth: 1930/12/30 Admit Date: 04/24/2011 PCP:No primary provider on file.  Subjective: No major issues overnight.   Objective: Vital signs in last 24 hours: Filed Vitals:   04/25/11 1500 04/25/11 2207 04/26/11 0435 04/26/11 1305  BP: 120/77 107/67 116/75 122/75  Pulse: 99 91 87 108  Temp: 97.9 F (36.6 C) 98.4 F (36.9 C) 98 F (36.7 C) 98.2 F (36.8 C)  TempSrc: Oral Oral Oral   Resp: 18 17 18 19   Height:      Weight:      SpO2: 94% 94% 96% 94%    Weight change:   Body mass index is 22.81 kg/(m^2).  Intake/Output from previous day:  Intake/Output Summary (Last 24 hours) at 04/26/11 1423 Last data filed at 04/26/11 0600  Gross per 24 hour  Intake    240 ml  Output      2 ml  Net    238 ml    PHYSICAL EXAM: Gen Exam: Awake and alert with clear speech.  Neck: Supple, No JVD.   Chest: B/L Clear.   CVS: S1 S2 Regular, no murmurs. Abdomen: soft, BS +, non tender, non distended.  Extremities: no edema, lower extremities warm to touch Neurologic: Non Focal.   Skin: No Rash.   Wounds: N/A.    CONSULTS:  neurology  LAB RESULTS: CBC  Lab 04/25/11 0558 04/24/11 1422  WBC 9.1 10.5  HGB 12.5 13.7  HCT 36.8 38.8  PLT 385 395  MCV 90.9 91.3  MCH 30.9 32.2  MCHC 34.0 35.3  RDW 12.9 13.0  LYMPHSABS -- 1.5  MONOABS -- 0.7  EOSABS -- 0.2  BASOSABS -- 0.0  BANDABS -- --    Chemistries   Lab 04/25/11 0558 04/24/11 1422  NA 136 136  K 4.0 4.3  CL 102 100  CO2 25 26  GLUCOSE 85 92  BUN 12 13  CREATININE 0.56 0.54  CALCIUM 9.0 9.5  MG -- --    GFR Estimated Creatinine Clearance: 53.4 ml/min (by C-G formula based on Cr of 0.56).  Coagulation profile  Lab 04/25/11 1003  INR 0.98  PROTIME --    Cardiac Enzymes No results found for this basename: CK:3,CKMB:3,TROPONINI:3,MYOGLOBIN:3 in the last 168 hours  No components found with this basename: POCBNP:3 No results found for  this basename: DDIMER:2 in the last 72 hours No results found for this basename: HGBA1C:2 in the last 72 hours No results found for this basename: CHOL:2,HDL:2,LDLCALC:2,TRIG:2,CHOLHDL:2,LDLDIRECT:2 in the last 72 hours  Basename 04/24/11 1817  TSH 5.559*  T4TOTAL --  T3FREE --  Marko Plume --    Basename 04/24/11 1817  VITAMINB12 1014*  FOLATE --  FERRITIN --  TIBC --  IRON --  RETICCTPCT --   No results found for this basename: LIPASE:2,AMYLASE:2 in the last 72 hours  Urine Studies No results found for this basename: UACOL:2,UAPR:2,USPG:2,UPH:2,UTP:2,UGL:2,UKET:2,UBIL:2,UHGB:2,UNIT:2,UROB:2,ULEU:2,UEPI:2,UWBC:2,URBC:2,UBAC:2,CAST:2,CRYS:2,UCOM:2,BILUA:2 in the last 72 hours  MICROBIOLOGY: No results found for this or any previous visit (from the past 240 hour(s)).  RADIOLOGY STUDIES/RESULTS: No results found.  MEDICATIONS: Scheduled Meds:    . bimatoprost  1 drop Both Eyes QHS  . docusate sodium  100 mg Oral BID  . levothyroxine  75 mcg Oral Q0600  . lisinopril  20 mg Oral Daily  . simvastatin  10 mg Oral q1800  . timolol  1-2 drop Right Eye Daily   Continuous Infusions:  PRN Meds:.acetaminophen, acetaminophen, alum & mag hydroxide-simeth, HYDROcodone-acetaminophen, ondansetron (ZOFRAN)  IV, ondansetron, polyethylene glycol, zolpidem  Antibiotics: Anti-infectives    None      Assessment/Plan: Patient Active Hospital Problem List:  Lower Ext Weakness/Incontinence/Cognitive Dysfunction -suspicion for Normal Pressure Hydrocephalus -LP  so today, apparently will be performed by Dr. Venetia Maxon in the next few days  Restless leg syndrome -Now on Requip -We'll check iron panel  HTN -controlled -continue with Lisinopril  Dyslipidemia -resume Statin  Hypothyroidism -continue with Levothyroxine  Glaucoma -Continue with Lumigan and Timolol  Disposition: -remain inpatient  DVT Prophylaxis: -B/L SCD's, start prophylactic lovenox once LP done  Code  Status: Full Code  Maretta Bees,  MD. 04/26/2011, 2:23 PM

## 2011-04-26 NOTE — Evaluation (Signed)
Physical Therapy Evaluation Patient Details Name: Linda Davila MRN: [id number] DOB: 10-Apr-1930 Today's Date: 04/26/2011  Problem List:  Patient Active Problem List  Diagnoses  . Weakness of both legs  . NPH (normal pressure hydrocephalus)  . Urinary incontinence without sensory awareness  . Gait abnormality  . Cognitive and behavioral changes  . Hypertension  . Hyperlipidemia  . Glaucoma    Past Medical History:  Past Medical History  Diagnosis Date  . Hypertension   . Hypercholesteremia   . Thyroid disease   . Vertigo    Past Surgical History:  Past Surgical History  Procedure Date  . Cholecystectomy   . Appendectomy   . Mastectomy   . Abdominal hysterectomy   . Hemorroidectomy     PT Assessment/Plan/Recommendation PT Assessment Clinical Impression Statement: Pt adm after fall.  Pt with ? normal pressure hydrocephalus and to have lumbar puncture soon (was cancelled for today).  I performed the pre lumbar puncture assessment today and it will be in shadow chart.  Feel pt will need ST-SNF at dc. PT Recommendation/Assessment: Patient will need skilled PT in the acute care venue PT Problem List: Decreased strength;Decreased knowledge of use of DME;Decreased balance;Decreased mobility PT Plan PT Frequency: Min 3X/week PT Treatment/Interventions: Gait training;DME instruction;Functional mobility training;Patient/family education;Therapeutic activities;Therapeutic exercise;Balance training PT Goals  Acute Rehab PT Goals PT Goal Formulation: With patient Time For Goal Achievement: 7 days Pt will go Sit to Stand: with supervision PT Goal: Sit to Stand - Progress: Goal set today Pt will go Stand to Sit: with supervision PT Goal: Stand to Sit - Progress: Goal set today Pt will Ambulate: 51 - 150 feet;with supervision PT Goal: Ambulate - Progress: Goal set today  PT Evaluation Precautions/Restrictions  Precautions Precautions: Fall Prior Functioning  Home Living Lives  With: Daughter (moved in with daughter 2 weeks ago due to decline) Receives Help From: Family Type of Home: House Home Layout: One level Home Access: Stairs to enter Entrance Stairs-Rails: Right Entrance Stairs-Number of Steps: 3 Home Adaptive Equipment: Walker - rolling Additional Comments: Pt and daughter do not feel pt is safe to return to daughter's home due to falls. Prior Function Level of Independence: Requires assistive device for independence;Independent with gait;Independent with transfers (with frequent falls) Driving: No (not recently) Cognition Cognition Arousal/Alertness: Awake/alert Overall Cognitive Status: Appears within functional limits for tasks assessed Orientation Level: Oriented X4 Sensation/Coordination   Extremity Assessment   Mobility (including Balance) Bed Mobility Bed Mobility: Yes Supine to Sit: 6: Modified independent (Device/Increase time);With rails Sitting - Scoot to Edge of Bed: 6: Modified independent (Device/Increase time) Sit to Supine: With rail;6: Modified independent (Device/Increase time) Transfers Sit to Stand: 4: Min assist;With upper extremity assist;From bed;From chair/3-in-1;With armrests Sit to Stand Details (indicate cue type and reason): Cues for hand placement Stand to Sit: 4: Min assist;With upper extremity assist;With armrests;To bed;To chair/3-in-1 Stand to Sit Details: verbal cues for hand placement and assist to control descent Ambulation/Gait Ambulation/Gait Assistance: 4: Min assist Ambulation/Gait Assistance Details (indicate cue type and reason): Assist to prevent posterior lean Ambulation Distance (Feet): 100 Feet Assistive device: Rolling walker Gait Pattern: Shuffle;Trunk flexed Gait velocity: 1.06 ft/sec    Exercise    End of Session PT - End of Session Equipment Utilized During Treatment: Gait belt Activity Tolerance: Patient tolerated treatment well Patient left: in bed Nurse Communication: Mobility status  for ambulation General Behavior During Session: [facility] for tasks performed Cognition: [facility] for tasks performed  [facility] 04/26/2011, 1:50 PM  [employer]  PT [phone]

## 2011-04-26 NOTE — Progress Notes (Signed)
   CARE MANAGEMENT NOTE 04/26/2011  Patient:  Linda Davila, Linda Davila   Account Number:  [id number]  Date Initiated:  04/26/2011  Documentation initiated by:  Letha Cape  Subjective/Objective Assessment:   dx weakness of legs  admit- lives alone, had some falls, stayed with daughter-having falls.     Action/Plan:   pt eval- rec st snf.  Neuro consulted.   Anticipated DC Date:  04/30/2011   Anticipated DC Plan:  SKILLED NURSING FACILITY  In-house referral  Clinical Social Worker      DC Planning Services  CM consult      Choice offered to / List presented to:             Status of service:  In process, will continue to follow Medicare Important Message given?   (If response is "NO", the following Medicare IM given date fields will be blank) Date Medicare IM given:   Date Additional Medicare IM given:    Discharge Disposition:    Per UR Regulation:    If discussed at Long Length of Stay Meetings, dates discussed:    Comments:  04/26/11 16:16 Letha Cape RN, BSN [phone] patient lives alone,  had some falls at home, went to stay with daughter- had falls at daughters as well , per physical therapy eval recs are for st snf.  CSW referral.

## 2011-04-26 NOTE — Progress Notes (Signed)
Utilization review completed.  

## 2011-04-26 NOTE — Progress Notes (Signed)
Patient ID: Linda Davila, female   DOB: Dec 27, 1930, 78 y.o.   MRN: [id number]  Received call from CT to check status/plan re: LP.  Pt was apparently admitted through the ER over the weekend d/y gait issues and falls and has been referred for a head CT & LP to evaluate NPH.  The CT was ordered as a pre-LP study, not to evaluate for head trauma.  With this information, and the patient's report of no head injury per CT staff, Dr. Venetia Maxon recommends cancelling the CT & LP scheduled for today.  He will plan to see the patient as a consult 3/19 am.   Prior to this admission, she had been scheduled for an LP on 04-30-11 with Dr. Venetia Maxon to further evaluate her suspected NPH Dx.  This will now be done on an inpatient basis by Dr. Venetia Maxon, earlier than Friday if at all possible.  Oris Drone Tavin Vernet, RN, BSN

## 2011-04-26 NOTE — Consult Note (Signed)
Subjective: Interval History: none.  Objective: Vital signs in last 24 hours: Temp:  [97.9 F (36.6 C)-98.4 F (36.9 C)] 98.2 F (36.8 C) (03/18 1305) Pulse Rate:  [87-108] 108  (03/18 1305) Resp:  [17-19] 19  (03/18 1305) BP: (107-122)/(67-77) 122/75 mmHg (03/18 1305) SpO2:  [94 %-96 %] 94 % (03/18 1305)  Intake/Output from previous day: 03/17 0701 - 03/18 0700 In: 480 [P.O.:480] Out: 2 [Urine:2]   Nutritional status: General  Neurological exam: AAO*3. No aphasia. Was able to tell me months of the year forwards and backwards correctly, exhibiting good attention span. Recall was 3 of 3 after 5 minutes. Followed complex commands. Cranial nerves: EOMI, PERRL. Visual fields were full. Sensation to V1 through V3 areas of the face was intact and symmetric throughout. There was no facial asymmetry. Hearing to finger rub was equal and symmetrical bilaterally. Shoulder shrug was 5/5 and symmetric bilaterally. Head rotation was 5/5 bilaterally. There was no dysarthria or palatal deviation. Motor: strength was 5/5 and symmetric throughout. Sensory: was intact throughout to light touch, pinprick. Coordination: finger-to-nose were intact and symmetric bilaterally. Reflexes: were 2+ in upper extremities and 1+ at the knees and 1+ at the ankles. Plantar response was downgoing bilaterally. Gait: Romberg test was positive. Not able to stand without support and would fall backwards.   Lab Results:  Basename 04/25/11 0558 04/24/11 1422  WBC 9.1 10.5  HGB 12.5 13.7  HCT 36.8 38.8  PLT 385 395  NA 136 136  K 4.0 4.3  CL 102 100  CO2 25 26  GLUCOSE 85 92  BUN 12 13  CREATININE 0.56 0.54  CALCIUM 9.0 9.5  LABA1C -- --   Medications: I have reviewed the patient's current medications.  Assessment/Plan: 78 years old woman with MRI consistent with NPH - Dr. Venetia Maxon will perform an LP himself Patient complains of symptoms of RLS - sent of iron studies. If iron is low, it should be repleted.  Start  Requip for RLS: 0.25 mg once daily 1-3 hours before bedtime. Dose may be increased after 2 days to 0.5 mg daily, and after 7 days to 1 mg daily. Dose may be further titrated upward in 0.5 mg increments every week until reaching a daily dose of 3 mg during week 6. If symptoms persist or reappear, the daily dose may be increased to a maximum of 4 mg beginning week 7. Call with questions.   LOS: 2 days   Catheryn Slifer

## 2011-04-27 LAB — GRAM STAIN: Special Requests: NORMAL

## 2011-04-27 LAB — CSF CELL COUNT WITH DIFFERENTIAL
RBC Count, CSF: 92 /mm3 — ABNORMAL HIGH
Tube #: 3

## 2011-04-27 NOTE — Progress Notes (Signed)
PATIENT DETAILS Name: Linda Davila Age: 78 y.o. Sex: female Date of Birth: [birth year] Admit Date: 04/24/2011 PCP:No primary provider on file.  Subjective: No major issues overnight- for LP today   Objective: Vital signs in last 24 hours: Filed Vitals:   04/26/11 0435 04/26/11 1305 04/26/11 2200 04/27/11 0539  BP: 116/75 122/75 101/68 133/84  Pulse: 87 108 102 82  Temp: 98 F (36.7 C) 98.2 F (36.8 C) 98 F (36.7 C) 98 F (36.7 C)  TempSrc: Oral  Oral Oral  Resp: 18 19 18 14   Height:      Weight:      SpO2: 96% 94% 95% 96%    Weight change:   Body mass index is 22.81 kg/(m^2).  Intake/Output from previous day:  Intake/Output Summary (Last 24 hours) at 04/27/11 0957 Last data filed at 04/26/11 1606  Gross per 24 hour  Intake    240 ml  Output      0 ml  Net    240 ml    PHYSICAL EXAM: Gen Exam: Awake and alert with clear speech.  Neck: Supple, No JVD.   Chest: B/L Clear.   CVS: S1 S2 Regular, no murmurs. Abdomen: soft, BS +, non tender, non distended.  Extremities: no edema, lower extremities warm to touch Neurologic: Non Focal.   Skin: No Rash.   Wounds: N/A.    CONSULTS:  neurology  LAB RESULTS: CBC  Lab 04/25/11 0558 04/24/11 1422  WBC 9.1 10.5  HGB 12.5 13.7  HCT 36.8 38.8  PLT 385 395  MCV 90.9 91.3  MCH 30.9 32.2  MCHC 34.0 35.3  RDW 12.9 13.0  LYMPHSABS -- 1.5  MONOABS -- 0.7  EOSABS -- 0.2  BASOSABS -- 0.0  BANDABS -- --    Chemistries   Lab 04/25/11 0558 04/24/11 1422  NA 136 136  K 4.0 4.3  CL 102 100  CO2 25 26  GLUCOSE 85 92  BUN 12 13  CREATININE 0.56 0.54  CALCIUM 9.0 9.5  MG -- --    GFR Estimated Creatinine Clearance: 53.4 ml/min (by C-G formula based on Cr of 0.56).  Coagulation profile  Lab 04/25/11 1003  INR 0.98  PROTIME --    Cardiac Enzymes No results found for this basename: CK:3,CKMB:3,TROPONINI:3,MYOGLOBIN:3 in the last 168 hours  No components found with this basename: POCBNP:3 No results  found for this basename: DDIMER:2 in the last 72 hours No results found for this basename: HGBA1C:2 in the last 72 hours No results found for this basename: CHOL:2,HDL:2,LDLCALC:2,TRIG:2,CHOLHDL:2,LDLDIRECT:2 in the last 72 hours  Basename 04/24/11 1817  TSH 5.559*  T4TOTAL --  T3FREE --  THYROIDAB --    Basename 04/26/11 1503 04/26/11 1329 04/24/11 1817  VITAMINB12 -- -- 1014*  FOLATE -- -- --  FERRITIN -- -- --  TIBC 282 273 --  IRON 70 77 --  RETICCTPCT 1.2 -- --   No results found for this basename: LIPASE:2,AMYLASE:2 in the last 72 hours  Urine Studies No results found for this basename: UACOL:2,UAPR:2,USPG:2,UPH:2,UTP:2,UGL:2,UKET:2,UBIL:2,UHGB:2,UNIT:2,UROB:2,ULEU:2,UEPI:2,UWBC:2,URBC:2,UBAC:2,CAST:2,CRYS:2,UCOM:2,BILUA:2 in the last 72 hours  MICROBIOLOGY: No results found for this or any previous visit (from the past 240 hour(s)).  RADIOLOGY STUDIES/RESULTS: No results found.  MEDICATIONS: Scheduled Meds:    . bimatoprost  1 drop Both Eyes QHS  . docusate sodium  100 mg Oral BID  . levothyroxine  75 mcg Oral Q0600  . lisinopril  20 mg Oral Daily  . simvastatin  10 mg Oral q1800  . timolol  1-2  drop Right Eye Daily   Continuous Infusions:  PRN Meds:.acetaminophen, acetaminophen, alum & mag hydroxide-simeth, HYDROcodone-acetaminophen, ondansetron (ZOFRAN) IV, ondansetron, polyethylene glycol, zolpidem  Antibiotics: Anti-infectives    None      Assessment/Plan: Patient Active Hospital Problem List:  Lower Ext Weakness/Incontinence/Cognitive Dysfunction -suspicion for Normal Pressure Hydrocephalus -LP   Today,by Dr Venetia Maxon -Neurosurgery will resume primary service  Restless leg syndrome -Now on Requip -Iron panel-levels are normal  HTN -controlled -continue with Lisinopril  Dyslipidemia -resume Statin  Hypothyroidism -continue with Levothyroxine  Glaucoma -Continue with Lumigan and Timolol  Disposition: -remain inpatient-but per  Neurosurgery patient needs to be transferred to 3000 and Neurosurgery will assume primary service  DVT Prophylaxis: -B/L SCD's, start prophylactic lovenox once LP done  Code Status: Full Code  Family Communication -updated patient and daughter at bedside, are agreeable.  Maretta Bees,  MD. 04/27/2011, 9:57 AM

## 2011-04-27 NOTE — Progress Notes (Signed)
Patient ID: Linda Davila, female   DOB: 08/25/1930, 78 y.o.   MRN: [id number] Pt is awake, alert, & conversant with daughter at bedside. Reviewed plan for LP in Neuro PACU this am by Dr. Venetia Maxon with pt, daughter, & attending MD. Care will be transferred to Dr. Venetia Maxon, Neurosurgery; and following the LP, pt will be transferred to a bed on 3000.  Activities coordinated with PT so follow-up assessment can be completed post-procedure on 3000. Pt & daughter agree with plan.  Oris Drone Shirline Kendle, RN, BSN

## 2011-04-27 NOTE — Progress Notes (Addendum)
NEUROSURGICAL CONSULTATION  Linda Davila #[id number] DOB: 03/30/1931 April 21, 2011  HISTORY: Linda Davila is a 78 year old retired woman for whom neurosurgical consultation was requested by Dr. Cristopher Peru for consideration of normal pressure hydrocephalus. She comes with her daughter today and did note that she is having difficulty with her memory and it is responding more slowly when discussing things. She developed urinary urgency and they have noted a progressively worsening gait disorder since the summer of 2011. They describe that she is barely lifting her feet and has a shuffling gait. In addition, Linda Davila has had some back pain since a fall two weeks ago and has noted this in both of her legs. They say that her problems began after she fell in the middle of 2011 but gait problems were significantly worse as of [place] of 2012 and they says she has had balance, incontinence and memory issues for about a week.  REVIEW OF SYSTEMS: A detailed Review of Systems sheet was reviewed with the patient. Pertinent positives include glasses, glaucoma, cataract removal bilaterally, balance disturbance, nasal congestion drainage, high blood pressure, heart murmur, mitral valve prolapse, high cholesterol, swelling in feet or hands, constipation, incontinence, leg weakness, problems with memory, problems with coordination in arms or legs, thyroid disease, excessive thirst or urination, and inhalant allergies.  PAST MEDICAL HISTORY:   Current Medical Conditions: Past Medical History additionally significant for hypertension, hypothyroidism, glaucoma and elevated cholesterol.  Prior Operations and Hospitalizations: Significant for bilateral mastectomy in 1987 with ductal hyperplasia with suspicious areas. She underwent implant placement after that. She has had a cholecystectomy in 1985, total vaginal hysterectomy in 1979, appendectomy 1950 and saphenous vein surgery at age 18.   Medications and Allergies:  Synthroid .075 mg. q.d., Lisinopril 20 mg. q.d, Timolol eye drops right eye b.i.d., Lumigan both eyes q.d., Lovastatin 20 mg. q.d., over-the-counter Refresh Tears, Claritin, Vitamin D. She is allergic to "eye drops".  Height and Weight: She is currently 5'4" tall, 139 pounds.  SOCIAL HISTORY: She denies tobacco, alcohol or drug use.  DIAGNOSTIC STUDIES: I had the opportunity to review an MRI of her cervical spine which is fairly unremarkable apart from multilevel degenerative changes. MRI of the brain showed ventricular enlargement but hydrocephalus may be secondary ex facto changes but could not exclude normal pressure hydrocephalus.  PHYSICAL EXAMINATION:   General Appearance: On examination today, Linda Davila is a pleasant and cooperative woman in no acute distress.   Blood Pressure, Pulse: 124/84, heart rate 74 and regular, respirations 18.   HEENT - normocephalic, atraumatic. The pupils are equal, round and reactive to light. The extraocular muscles are intact. Sclerae - white. Conjunctiva - pink. Oropharynx benign. Uvula midline.   Neck - there are no masses, meningismus, deformities, tracheal deviation, jugular vein distention or carotid bruits. There is normal cervical range of motion. Spurlings' test is negative without reproducible radicular pain turning the patient's head to either side. Lhermitte's sign is not present with axial compression.   Respiratory - there is normal respiratory effort with good intercostal function. Lungs are clear to auscultation. There are no rales, rhonchi or wheezes.   Cardiovascular - the heart has regular rate and rhythm to auscultation. No murmurs are appreciated. There is no extremity edema, cyanosis or clubbing. There are palpable pedal pulses.   Abdomen - soft, nontender, no hepatosplenomegaly appreciated or masses. There are active bowel sounds. No guarding or rebound.   Musculoskeletal Examination - Linda Davila gets up slowly from her chair. She  walks with what appears to be a magnetic gait. She has to use a walker. She turns very slowly. She has a negative Romberg test.  NEUROLOGICAL EXAMINATION: The patient is oriented to time, person and place and has good recall of both recent and remote memory with normal attention span and concentration. The patient speaks with clear and fluent speech and exhibits normal language function and appropriate fund of knowledge.   Cranial Nerve Examination - pupils are equal, round and reactive to light. Previous cataract surgery. Extraocular movements are full. Visual fields are full to confrontational testing. Facial sensation and facial movement are symmetric and intact. Hearing is intact to finger rub. Palate is upgoing. Shoulder shrug is symmetric. Tongue protrudes in the midline.   Motor Examination - motor strength is 5/5 in the bilateral deltoids, biceps, triceps, handgrips, wrist extensors, interosseous. In the lower extremities motor strength is 5/5 in hip flexion, extension, quadriceps, hamstrings, plantar flexion, dorsiflexion and extensor hallucis longus.   Sensory Examination - normal to light touch and pinprick sensation in the upper and lower extremities.   Deep Tendon Reflexes - 2 in the biceps, triceps, and brachioradialis, 2 in the knees, 2 in the ankles. The great toes are downgoing to plantar stimulation. No pathologic reflexes.   Cerebellar Examination - normal coordination in upper and lower extremities and normal rapid alternating movements. Romberg test is negative.  IMPRESSION AND RECOMMENDATIONS: Linda Davila is a 78 year old woman who has imaging, physical examination and history all consistent with normal pressure hydrocephalus. She has developed incontinence. She has begun to develop signs of dementia and she has a longer standing gait disorder which appears consistent with magnetic gait. I discussed these findings in detail with the patient and her daughter and advised that we  could do a high volume lumbar puncture as the next maneuver to see if this was consistent with normal pressure hydrocephalus and that if she responded well to this I would then recommend pursuing ventriculoperitoneal shunt placement. We had a lengthy discussion about the details of shunt surgery as well as the spinal tap and I would recommend that we get physical therapy assessment prior to and after the spinal tap. They wish to pursue this and the plan is to do this at the hospital with physical therapy assessment prior to lumbar puncture, followed by lumbar puncture, followed by repeat physical therapy assessment. I will make further recommendations after we do that depending on what kind of response we get from that.  [facility]  Danae Orleans. Venetia Maxon, M.D.  JDS:gde cc: Dr. Cristopher Peru Dr. Daniel Nones    Plan: High volume LP with PT assessment before and after LP today.

## 2011-04-27 NOTE — Progress Notes (Signed)
04/27/11 Nsg 1125 Pt. Being transferred to 3020 after her L/P was done.  Reported called to Morrie Sheldon, Charity fundraiser.  Skin intact at transfer.  Forbes Cellar, RN

## 2011-04-27 NOTE — Progress Notes (Signed)
PT Re-assessment for NPH after Lumbar puncture  Full reassessment on paper in shadow chart.  Pt with significant improvements in all areas assessed. Summary of results below:  Gait speed pre LP - 1.06 ft/sec   Gait speed post LP - 1.68 ft/sec Timed Up and Go pre LP- 50.0 sec  Timed Up and Go post LP - 33.8 sec # of steps for 360 degree turn pre LP - 26  # of steps for 360 degree turn post LP - 14  Quality of gait pre LP - pt with short step length, shuffling steps Quality of gait post LP - pt with incr step length, feet clearing floor.  Currently pt will need ST-SNF.  If pt has shunt placed will re-assess discharge destination.  [employer] PT [phone]

## 2011-04-27 NOTE — Progress Notes (Signed)
Patient ID: Linda Davila, female   DOB: 1930-05-14, 78 y.o.   MRN: [id number] Doing well s/p PT eval. Plan is now for d/c to SNF/assisted living; then vp shunt placement hopefully 2nd week April with Dr. Abbey Chatters to assist Dr. Venetia Maxon. Daughter not present, but pt agrees with plan. Will proceed with placement & d/c.  Oris Drone. Naleah Kofoed, RN, BSN

## 2011-04-27 NOTE — Procedures (Signed)
LP performed under sterile conditions.  30 cc withdrawn.  Sent for routine studies.  OP 10 cm H2O, closing pressure 0 cm H20.  Patient tolerated procedure well.

## 2011-04-27 NOTE — Progress Notes (Signed)
Patient ID: Linda Davila, female   DOB: Feb 20, 1930, 78 y.o.   MRN: [id number] Now in 3020, supine s/p LP for CSF. Alert, conversant, with daughter at bedside. No c/o pain or discomfort. Pt understands plan to work with PT for post-procedure assessment in the next hour or so. After PT has completed assessment, pt may mobilize as tolerated - max assist d/t balance & weakness issues. Daughter has spoken with Child psychotherapist re: SNF placement until (possible) vp shunt placement for NPH.  Oris Drone Mikiala Fugett, RN, BSN

## 2011-04-28 ENCOUNTER — Other Ambulatory Visit: Payer: Self-pay | Admitting: Neurosurgery

## 2011-04-28 NOTE — Progress Notes (Signed)
Subjective: Patient reports "I feel fine."  Objective: Vital signs in last 24 hours: Temp:  [97.8 F (36.6 C)-98.7 F (37.1 C)] 98.3 F (36.8 C) (03/20 0554) Pulse Rate:  [86-109] 94  (03/20 0554) Resp:  [18-20] 20  (03/20 0554) BP: (101-132)/(60-85) 101/60 mmHg (03/20 0554) SpO2:  [94 %-98 %] 96 % (03/20 0554)  Intake/Output from previous day:   Intake/Output this shift:    Alert, conversant. No c/o pain or discomfort. LP site without erythema or swelling. No reported h/a last 24hrs.   Lab Results: No results found for this basename: WBC:2,HGB:2,HCT:2,PLT:2 in the last 72 hours BMET No results found for this basename: NA:2,K:2,CL:2,CO2:2,GLUCOSE:2,BUN:2,CREATININE:2,CALCIUM:2 in the last 72 hours  Studies/Results: No results found.  Assessment/Plan: Stable  LOS: 4 days  Planning SNF/ALF placement until vp shunt can be placed.    Georgiann Cocker 04/28/2011, 7:57 AM

## 2011-04-28 NOTE — Progress Notes (Signed)
Much better after high volume LP. No headache. Plan as above.

## 2011-04-28 NOTE — Progress Notes (Signed)
Clinical Social Work Department BRIEF PSYCHOSOCIAL ASSESSMENT 04/28/2011  Patient:  RMONI, KEPLINGER     Account Number:  [id number]     Admit date:  04/24/2011  Clinical Social Worker:  Peggyann Shoals  Date/Time:  04/28/2011 02:00 PM  Referred by:  Physician  Date Referred:  04/28/2011 Referred for  SNF Placement   Other Referral:   Interview type:  Family Other interview type:    PSYCHOSOCIAL DATA Living Status:  ALONE Admitted from facility:   Level of care:   Primary support name:  Buena Irish Primary support relationship to patient:  CHILD, ADULT Degree of support available:   Very supportive. Contact informatio:  [phone] (home) and [phone] (cell).    CURRENT CONCERNS Current Concerns  Post-Acute Placement   Other Concerns:    SOCIAL WORK ASSESSMENT / PLAN CSW reviewed medical record and consult with bedside RN. CSW met with pt and pt's daughter, Misty Stanley, to address consult. CSW introduced herself and explained role of social work. Pt reported that she has had several falls at home and while staying with her daughter. Pt needs to have a shunt placed, however will need short term SNF while awaiting shunt. MD is agreeable to this as well. CSW explained the process of SNF placement. Pt and daughter are agreeable to SNF placement at discharge of this admission. Pt and daughter also understand that pt will be reassess after shunt placed.   Assessment/plan status:  Other - See comment Other assessment/ plan:   CSW will complete FL2 and send request to [facility]. CSW will then follow up with bed offers. CSW will continue to follow to facilitate discharge to SNF.   Information/referral to community resources:   as needed.    PATIENT'S/FAMILY'S RESPONSE TO PLAN OF CARE: Pt was very pleasant and was alert and oriented. Pt is agreeable to SNF placement at discharge from this admission.

## 2011-04-29 ENCOUNTER — Other Ambulatory Visit (INDEPENDENT_AMBULATORY_CARE_PROVIDER_SITE_OTHER): Payer: Self-pay | Admitting: General Surgery

## 2011-04-29 MED ORDER — CEFAZOLIN SODIUM-DEXTROSE 2-3 GM-% IV SOLR: 2.0000 g | INTRAVENOUS | Status: DC

## 2011-04-29 MED ORDER — MENTHOL 3 MG MT LOZG
1.0000 | LOZENGE | OROMUCOSAL | Status: DC | PRN
  Filled 2011-04-29 (×2): qty 9

## 2011-04-29 NOTE — Progress Notes (Signed)
Subjective: Patient reports "I'm doing well. My throat is a little sore; but that's all."  Objective: Vital signs in last 24 hours: Temp:  [97.8 F (36.6 C)-98.2 F (36.8 C)] 98.1 F (36.7 C) (03/21 0600) Pulse Rate:  [90-107] 90  (03/21 0600) Resp:  [18-20] 18  (03/21 0600) BP: (98-141)/(64-87) 122/87 mmHg (03/21 0600) SpO2:  [92 %-96 %] 96 % (03/21 0600)  Intake/Output from previous day: 03/20 0701 - 03/21 0700 In: 840 [P.O.:840] Out: 1 [Stool:1] Intake/Output this shift:    Alert, conversant; sitting in chair & eating breakfast. No c/o pain or discomfort.   Lab Results: No results found for this basename: WBC:2,HGB:2,HCT:2,PLT:2 in the last 72 hours BMET No results found for this basename: NA:2,K:2,CL:2,CO2:2,GLUCOSE:2,BUN:2,CREATININE:2,CALCIUM:2 in the last 72 hours  Studies/Results: No results found.  Assessment/Plan: Stable  LOS: 5 days  Awaiting SNF placement. Tentative v.p. shunt placement date is April 12th.    Georgiann Cocker 04/29/2011, 8:27 AM

## 2011-04-29 NOTE — Progress Notes (Signed)
Patient ID: Linda Davila, female   DOB: 1930-07-12, 78 y.o.   MRN: [id number] Alert, conversant. Only c/o mild sore throat today. Awaiting SNF placement/selection. Reviewed plan with pt: SNF/ALF until tentative date of vp shunt placement (12th). Will follow as o/p in coming months to adjust shunt pressure prn.   Georgiann Cocker RN, BSN

## 2011-04-29 NOTE — Progress Notes (Signed)
CSW met with pt and spoke with pt's daughter on the phone regarding bed offers. [name] is interested in [facility]. The facility is "considering." CSW contacted facility to inquire about the need of more information. CSW sent PT notes for further evaluation. A representative will visit pt at hospital. CSW contacted pt's daughter re: this. Pt's first choice is [facility] and [facility] is second choice. CSW will continue to follow to facilitate discharge to SNF tomorrow. Georgiann Cocker, RN is aware.   Dede Query, MSW, Theresia Majors [phone]

## 2011-04-29 NOTE — Progress Notes (Signed)
Physical Therapy Treatment Patient Details Name: Linda Davila MRN: [id number] DOB: 1930-05-22 Today's Date: 04/29/2011  PT Assessment/Plan  PT - Assessment/Plan Comments on Treatment Session: Patient with improving mobility when cues provided.  PT Plan: Frequency remains appropriate;Discharge plan needs to be updated PT Frequency: Min 3X/week Follow Up Recommendations: Skilled nursing facility Equipment Recommended: Defer to next venue PT Goals  Acute Rehab PT Goals PT Goal: Sit to Stand - Progress: Progressing toward goal PT Goal: Stand to Sit - Progress: Progressing toward goal PT Goal: Ambulate - Progress: Progressing toward goal  PT Treatment Precautions/Restrictions  Precautions Precautions: Fall Restrictions Weight Bearing Restrictions: No Mobility (including Balance) Transfers Sit to Stand: 4: Min assist;From chair/3-in-1;With armrests;With upper extremity assist Sit to Stand Details (indicate cue type and reason): Min-guard assistance - recalled hand placement correctly without cueing today Stand to Sit: To chair/3-in-1;4: Min assist;With armrests;With upper extremity assist Stand to Sit Details: Min-guard assistance - recalled hand placement correctly without cueing today Ambulation/Gait Ambulation/Gait Assistance: 4: Min assist Ambulation/Gait Assistance Details (indicate cue type and reason): Min-guard assistance with walker. Auditory cues (metronome) for increasing step length and decreasing step cadence secondary for tendency to shuffle with gait. Also worked in room without device on walking forward and backward with increased stride 5 feet x 6  Ambulation Distance (Feet): 300 Feet Assistive device: Rolling walker Gait Pattern: Shuffle;Trunk flexed  High Level Balance High Level Balance Activites: Backward walking;Turns;Direction changes High Level Balance Comments: Worked with patient on movement amplitude to decrease number of steps witrh turns - 90,180 and 360  degrees. Patient with difficulty with large ampliturde movement unless cues provided, but with cueing patient able to achieve 360 degree turns in 6-8 steps. End of Session PT - End of Session Equipment Utilized During Treatment: Gait belt Activity Tolerance: Patient tolerated treatment well Patient left: in chair;with call bell in reach Nurse Communication:  (Nurse tech in room making bed, so aware) General Behavior During Session: [facility] for tasks performed Cognition: [facility] for tasks performed  Edwyna Perfect, PT  Pager [phone]  04/29/2011, 12:30 PM

## 2011-04-30 ENCOUNTER — Ambulatory Visit: Admit: 2011-04-30 | Payer: Self-pay | Admitting: Neurosurgery

## 2011-04-30 ENCOUNTER — Inpatient Hospital Stay (HOSPITAL_COMMUNITY): Admission: RE | Admit: 2011-04-30 | Payer: Self-pay | Source: Ambulatory Visit

## 2011-04-30 LAB — BODY FLUID CULTURE

## 2011-04-30 SURGERY — LUMBAR WOUND DEBRIDEMENT
Anesthesia: LOCAL

## 2011-04-30 MED ORDER — WHITE PETROLATUM GEL
Status: AC
  Filled 2011-04-30: qty 5

## 2011-04-30 NOTE — Discharge Summary (Signed)
Physician Discharge Summary  Patient ID: Linda Davila MRN: [id number] DOB/AGE: 1930/07/18 78 y.o.  Admit date: 04/24/2011 Discharge date: 04/30/2011  Admission Diagnoses: Unsteady gait, weakness, urinary incontinence progressive   Discharge Diagnoses: Unsteady gait, weakness, urinary incontinence progressive; s/p lumbar puncture with improvement in symptoms.  Principal Problem:  *Weakness of both legs Active Problems:  NPH (normal pressure hydrocephalus)  Urinary incontinence without sensory awareness  Gait abnormality  Cognitive and behavioral changes  Hypertension  Hyperlipidemia  Glaucoma   Discharged Condition: good  Hospital Course: This lady was admitted through the ER with usteady gait, weakness, urinary incontinence progressive. LP was performed by Dr. Venetia Maxon with PT f/u assessment further indicating dx of normal pressure hydrocephalus. Currently, she is awaiting SNF placement as her symptoms prevent her from staying alone.    Consults: None  Significant Diagnostic Studies:   Treatments: procedures: LP performed 04/27/11. (previously scheduled as o/p procedure for 04/30/11)  Discharge Exam: Blood pressure 116/71, pulse 94, temperature 97.9 F (36.6 C), temperature source Oral, resp. rate 19, height 5\' 6"  (1.676 m), weight 64.093 kg (141 lb 4.8 oz), SpO2 95.00%. Alert, conversant, without c/o pain or discomfort. LP site without erythema, swelling, or drainage. No h/a since LP.  Disposition: Awaiting SNF placement. Tentative v.p. shunt placement date is April 12th.    Discharge Orders    Future Appointments: Provider: Department: Dept Phone: Center:   04/30/2011 12:00 PM [facility]  None   05/04/2011 10:50 AM Adolph Pollack, MD [facility] [phone] None     Medication List  As of 04/30/2011 10:26 AM   TAKE these medications         bimatoprost 0.03 % ophthalmic solution   Commonly known as: LUMIGAN   Place 1 drop into both eyes  at bedtime.      levothyroxine 75 MCG tablet   Commonly known as: SYNTHROID, LEVOTHROID   Take 75 mcg by mouth daily.      lisinopril 20 MG tablet   Commonly known as: PRINIVIL,ZESTRIL   Take 20 mg by mouth daily.      loratadine 10 MG tablet   Commonly known as: CLARITIN   Take 10 mg by mouth daily.      lovastatin 20 MG tablet   Commonly known as: MEVACOR   Take 20 mg by mouth at bedtime.      timolol 0.25 % ophthalmic solution   Commonly known as: BETIMOL   Place 1-2 drops into the right eye daily.      VITAMIN D (ERGOCALCIFEROL) PO   Take 1,000 Units by mouth daily.             Signed: Georgiann Cocker 04/30/2011, 10:26 AM

## 2011-04-30 NOTE — Progress Notes (Signed)
Clinical Social Worker facilitated patient discharge including contacting patient daughter and facility and arranging ambulance transport to [facility].  Patient and patient daughter agreeable with discharge plan and transport.    Clinical Social Worker will sign off for now as social work intervention is no longer needed. Please consult [place] again if new need arises.  [address] [place], [place] [phone]

## 2011-05-04 ENCOUNTER — Encounter (INDEPENDENT_AMBULATORY_CARE_PROVIDER_SITE_OTHER): Payer: Self-pay | Admitting: General Surgery

## 2011-05-04 ENCOUNTER — Ambulatory Visit (INDEPENDENT_AMBULATORY_CARE_PROVIDER_SITE_OTHER): Payer: Medicare Other | Admitting: General Surgery

## 2011-05-04 DIAGNOSIS — G912 (Idiopathic) normal pressure hydrocephalus: Secondary | ICD-10-CM

## 2011-05-04 NOTE — Patient Instructions (Signed)
Do not take aspirin or fish oil for 7 days before the surgery

## 2011-05-04 NOTE — Progress Notes (Signed)
Patient ID: Linda Davila, female   DOB: 10-Sep-1930, 78 y.o.   MRN: [id number]  No chief complaint on file.   HPI Linda Davila is a 78 y.o. female.   HPI  She is referred by Dr. Venetia Maxon.  She has a normal pressure hydrocephalus. The plan is for a ventriculoperitoneal shunt. I am going to perform the peritoneal placement laparoscopically in coordination with Dr. Venetia Maxon. She has had problems with the gait, urinary continence, and memory recently and has been diagnosed with normal pressure hydrocephalus.  Past Medical History  Diagnosis Date  . Hypertension   . Hypercholesteremia   . Thyroid disease   . Vertigo   . Cancer     skin/ on back  . Heart murmur     Past Surgical History  Procedure Date  . Cholecystectomy   . Appendectomy   . Mastectomy   . Abdominal hysterectomy   . Hemorroidectomy     Family History  Problem Relation Age of Onset  . Cancer Daughter     breast    Social History History  Substance Use Topics  . Smoking status: Never Smoker   . Smokeless tobacco: Not on file  . Alcohol Use: No    No Known Allergies  Current Outpatient Prescriptions  Medication Sig Dispense Refill  . bimatoprost (LUMIGAN) 0.03 % ophthalmic solution Place 1 drop into both eyes at bedtime.      Marland Kitchen levothyroxine (SYNTHROID, LEVOTHROID) 75 MCG tablet Take 75 mcg by mouth daily.      Marland Kitchen lisinopril (PRINIVIL,ZESTRIL) 20 MG tablet Take 20 mg by mouth daily.      Marland Kitchen loratadine (CLARITIN) 10 MG tablet Take 10 mg by mouth daily.      Marland Kitchen lovastatin (MEVACOR) 20 MG tablet Take 20 mg by mouth at bedtime.      . timolol (BETIMOL) 0.25 % ophthalmic solution Place 1-2 drops into the right eye daily.      Marland Kitchen VITAMIN D, ERGOCALCIFEROL, PO Take 1,000 Units by mouth daily.        Review of Systems Review of Systems  Constitutional: Negative.   Respiratory: Negative.   Cardiovascular: Negative.     There were no vitals taken for this visit.  Physical Exam Physical Exam  Constitutional: She  appears well-developed and well-nourished. No distress.  Abdominal: Soft. She exhibits no mass. There is no tenderness.       Right subcostal scar. Right lower quadrant scar.  Skin: Skin is warm and dry.    Data Reviewed Hospital notes  Assessment    Symptomatic normal pressure hydrocephalus.    Plan    Laparoscopic-assisted ventriculoperitoneal shunt placement. The procedure and risks were discussed with her and her daughter. Risks include but are not limited to bleeding, infection, injury to abdominal organs, anesthesia, wound healing problems.       Linda Davila 05/04/2011, 11:58 AM

## 2011-05-04 NOTE — Progress Notes (Signed)
LATE ENTRY  Clinical Social Work Department CLINICAL SOCIAL WORK PLACEMENT NOTE 05/04/2011  Patient:  Linda Davila, Linda Davila  Account Number:  [id number] Admit date:  04/24/2011  Clinical Social Worker:  Peggyann Shoals  Date/time:  05/04/2011 09:00 AM  Clinical Social Work is seeking post-discharge placement for this patient at the following level of care:   SKILLED NURSING   (*CSW will update this form in Epic as items are completed)   04/28/2011  Patient/family provided with [facility] Department of Clinical Social Work's list of facilities offering this level of care within the geographic area requested by the patient (or if unable, by the patient's family).  04/28/2011  Patient/family informed of their freedom to choose among providers that offer the needed level of care, that participate in Medicare, Medicaid or managed care program needed by the patient, have an available bed and are willing to accept the patient.  04/28/2011  Patient/family informed of [facility]' ownership interest in [facility], as well as of the fact that they are under no obligation to receive care at this facility.  PASARR submitted to EDS on 04/28/2011 PASARR number received from EDS on 04/29/2011  FL2 transmitted to all facilities in geographic area requested by pt/family on  04/28/2011 FL2 transmitted to all facilities within larger geographic area on   Patient informed that his/her managed care company has contracts with or will negotiate with  certain facilities, including the following:     Patient/family informed of bed offers received:  04/29/2011 Patient chooses bed at [facility] Physician recommends and patient chooses bed at  [facility]  Patient to be transferred to [facility] on  04/30/2011 Patient to be transferred to facility by [facility]  The following physician request were entered in Epic:   Additional Comments:

## 2011-05-14 ENCOUNTER — Other Ambulatory Visit: Payer: Self-pay | Admitting: Neurosurgery

## 2011-05-14 NOTE — Pre-Procedure Instructions (Signed)
20 Linda Davila  05/14/2011   Your procedure is scheduled on:  Friday May 21, 2011 at 0730 am.  Report to [facility] at 0530 AM.  Call this number if you have problems the morning of surgery: [phone]   Remember:   Do not eat food:After Midnight.  May have clear liquids: up to 4 Hours before arrival until 0130 am.  Clear liquids include soda, tea, black coffee, apple or grape juice, broth.  Take these medicines the morning of surgery with A SIP OF WATER: Levothyroxine (Synthroid)   Do not wear jewelry, make-up or nail polish.  Do not wear lotions, powders, or perfumes. You may wear deodorant.  Do not shave 48 hours prior to surgery.  Do not bring valuables to the hospital.  Contacts, dentures or bridgework may not be worn into surgery.  Leave suitcase in the car. After surgery it may be brought to your room.  For patients admitted to the hospital, checkout time is 11:00 AM the day of discharge.   Patients discharged the day of surgery will not be allowed to drive home.  Name and phone number of your driver:   Special Instructions: CHG Shower Use Special Wash: 1/2 bottle night before surgery and 1/2 bottle morning of surgery.   Please read over the following fact sheets that you were given: Pain Booklet, Coughing and Deep Breathing, MRSA Information and Surgical Site Infection Prevention

## 2011-05-17 ENCOUNTER — Encounter (HOSPITAL_COMMUNITY)
Admission: RE | Admit: 2011-05-17 | Discharge: 2011-05-17 | Disposition: A | Discharge status: Home / Self Care | Payer: Medicare Other | Source: Ambulatory Visit | Attending: Neurosurgery | Admitting: Neurosurgery

## 2011-05-17 ENCOUNTER — Encounter (HOSPITAL_COMMUNITY)
Admission: RE | Admit: 2011-05-17 | Discharge: 2011-05-17 | Disposition: A | Discharge status: Home / Self Care | Payer: Medicare Other | Source: Ambulatory Visit | Attending: Anesthesiology | Admitting: Anesthesiology

## 2011-05-17 ENCOUNTER — Encounter (HOSPITAL_COMMUNITY): Payer: Self-pay

## 2011-05-17 HISTORY — DX: Nausea with vomiting, unspecified: R11.2

## 2011-05-17 HISTORY — DX: Hypothyroidism, unspecified: E03.9

## 2011-05-17 HISTORY — DX: Nausea with vomiting, unspecified: Z98.890

## 2011-05-17 LAB — BASIC METABOLIC PANEL
BUN: 10 mg/dL (ref 6–23)
CO2: 24 mEq/L (ref 19–32)
Chloride: 101 mEq/L (ref 96–112)
GFR calc Af Amer: >90 mL/min (ref >90)
Glucose, Bld: 79 mg/dL (ref 70–99)
Potassium: 4.3 mEq/L (ref 3.5–5.1)

## 2011-05-17 LAB — CBC
HCT: 40.2 % (ref 36.0–46.0)
Hemoglobin: 13.7 g/dL (ref 12.0–15.0)
RBC: 4.34 MIL/uL (ref 3.87–5.11)
WBC: 9.1 10*3/uL (ref 4.0–10.5)

## 2011-05-17 LAB — SURGICAL PCR SCREEN: Staphylococcus aureus: NEGATIVE

## 2011-05-18 NOTE — Consult Note (Addendum)
Anesthesia:  Patient is a 78 year old female scheduled for a VP shunt by Dr. Venetia Maxon in conjunction with Dr. Abbey Chatters on 05/21/11 for normal pressure hydrocephalus.  History includes hypothyroidism, HTN, HLD, skin CA, vertigo, non-smoker, heart murmur with mild-mod AI by 11/2010 echo, post-operative N/V. PCP is Dr.Bert Graciela Husbands at [facility].  Labs acceptable.  CXR showed no acute findings.  EKG from 04/24/11 shows SR, possible LAE, incomplete right BBB, LAFB.  This is not significantly changed from her EKG at [facility] on 04/15/10 (pre-operative EKG for cataract surgery).  No CV symptoms were reported.    She had an echo on 11/26/10 that showed normal LV systolic function, EF > 60%, aortic sclerosis with mild-moderate AI, mild MR/TR, normal RV systolic function with normal pressures.  Her last stress test was on 06/22/06 and showed normal myocardial perfusion without evidence of ischemia, EF 74%.  Addendum: 05/19/11 1330      I received her EKG from May 2008.  She had a LAFB at that time.  She has since developed a right BBB which was present at least one year ago.  Yesterday I left a message for Linda Davila to call me, but have yet to get a response.  Again, no CV symptoms were reported at PAT.  Since her EKG has been stable for at least the past year, she had a normal EF by recent echo, and by PAT history was asymptomatic, then anticipate she can proceed.  Reviewed with Anesthesiologist Dr. Randa Evens who agrees with this plan.

## 2011-05-20 ENCOUNTER — Other Ambulatory Visit (INDEPENDENT_AMBULATORY_CARE_PROVIDER_SITE_OTHER): Payer: Self-pay | Admitting: General Surgery

## 2011-05-20 MED ORDER — CEFAZOLIN SODIUM-DEXTROSE 2-3 GM-% IV SOLR
2.0000 g | INTRAVENOUS | Status: AC
  Administered 2011-05-21: 2 g via INTRAVENOUS
  Filled 2011-05-20: qty 50

## 2011-05-20 NOTE — Progress Notes (Signed)
Requested orders from Dr. Maris Berger  Office.

## 2011-05-21 ENCOUNTER — Ambulatory Visit (HOSPITAL_COMMUNITY): Payer: Medicare Other | Admitting: Vascular Surgery

## 2011-05-21 ENCOUNTER — Encounter (HOSPITAL_COMMUNITY)
Admission: RE | Disposition: A | Discharge status: Skilled Nursing Facility | Payer: Self-pay | Source: Ambulatory Visit | Attending: Neurosurgery

## 2011-05-21 ENCOUNTER — Encounter (HOSPITAL_COMMUNITY): Payer: Self-pay | Admitting: Vascular Surgery

## 2011-05-21 ENCOUNTER — Encounter (HOSPITAL_COMMUNITY): Payer: Self-pay | Admitting: *Deleted

## 2011-05-21 ENCOUNTER — Inpatient Hospital Stay (HOSPITAL_COMMUNITY)
Admission: RE | Admit: 2011-05-21 | Discharge: 2011-05-28 | DRG: 032 | Disposition: A | Discharge status: Skilled Nursing Facility | Payer: Medicare Other | Source: Ambulatory Visit | Attending: Neurosurgery | Admitting: Neurosurgery

## 2011-05-21 ENCOUNTER — Inpatient Hospital Stay (HOSPITAL_COMMUNITY): Payer: Medicare Other

## 2011-05-21 DIAGNOSIS — F0390 Unspecified dementia without behavioral disturbance: Secondary | ICD-10-CM | POA: Diagnosis present

## 2011-05-21 DIAGNOSIS — Z9010 Acquired absence of unspecified breast and nipple: Secondary | ICD-10-CM

## 2011-05-21 DIAGNOSIS — K5900 Constipation, unspecified: Secondary | ICD-10-CM | POA: Diagnosis present

## 2011-05-21 DIAGNOSIS — E039 Hypothyroidism, unspecified: Secondary | ICD-10-CM | POA: Diagnosis present

## 2011-05-21 DIAGNOSIS — I10 Essential (primary) hypertension: Secondary | ICD-10-CM | POA: Diagnosis present

## 2011-05-21 DIAGNOSIS — G911 Obstructive hydrocephalus: Secondary | ICD-10-CM

## 2011-05-21 DIAGNOSIS — Z803 Family history of malignant neoplasm of breast: Secondary | ICD-10-CM

## 2011-05-21 DIAGNOSIS — G912 (Idiopathic) normal pressure hydrocephalus: Principal | ICD-10-CM | POA: Diagnosis present

## 2011-05-21 DIAGNOSIS — E7800 Pure hypercholesterolemia, unspecified: Secondary | ICD-10-CM | POA: Diagnosis present

## 2011-05-21 DIAGNOSIS — G8190 Hemiplegia, unspecified affecting unspecified side: Secondary | ICD-10-CM | POA: Diagnosis present

## 2011-05-21 DIAGNOSIS — H409 Unspecified glaucoma: Secondary | ICD-10-CM | POA: Diagnosis present

## 2011-05-21 DIAGNOSIS — Z01812 Encounter for preprocedural laboratory examination: Secondary | ICD-10-CM

## 2011-05-21 DIAGNOSIS — Z79899 Other long term (current) drug therapy: Secondary | ICD-10-CM

## 2011-05-21 DIAGNOSIS — R339 Retention of urine, unspecified: Secondary | ICD-10-CM | POA: Diagnosis present

## 2011-05-21 DIAGNOSIS — R32 Unspecified urinary incontinence: Secondary | ICD-10-CM | POA: Diagnosis present

## 2011-05-21 HISTORY — PX: VENTRICULOPERITONEAL SHUNT: SHX204

## 2011-05-21 SURGERY — SHUNT INSERTION VENTRICULAR-PERITONEAL
Anesthesia: General | Site: Head | Wound class: Clean

## 2011-05-21 MED ORDER — POTASSIUM CHLORIDE IN NACL 20-0.9 MEQ/L-% IV SOLN
INTRAVENOUS | Status: DC
  Administered 2011-05-21 – 2011-05-23 (×3): via INTRAVENOUS
  Filled 2011-05-21 (×14): qty 1000

## 2011-05-21 MED ORDER — ALBUMIN HUMAN 5 % IV SOLN
INTRAVENOUS | Status: DC | PRN
  Administered 2011-05-21: 09:00:00 via INTRAVENOUS

## 2011-05-21 MED ORDER — FENTANYL CITRATE 0.05 MG/ML IJ SOLN
INTRAMUSCULAR | Status: DC | PRN
  Administered 2011-05-21 (×3): 25 ug via INTRAVENOUS

## 2011-05-21 MED ORDER — LORATADINE 10 MG PO TABS
10.0000 mg | ORAL_TABLET | Freq: Every day | ORAL | Status: DC | PRN
  Filled 2011-05-21: qty 1

## 2011-05-21 MED ORDER — LACTATED RINGERS IV SOLN: INTRAVENOUS | Status: DC

## 2011-05-21 MED ORDER — PROPOFOL 10 MG/ML IV EMUL
INTRAVENOUS | Status: DC | PRN
  Administered 2011-05-21: 150 mg via INTRAVENOUS
  Administered 2011-05-21: 30 mg via INTRAVENOUS

## 2011-05-21 MED ORDER — LEVOTHYROXINE SODIUM 75 MCG PO TABS
75.0000 ug | ORAL_TABLET | Freq: Every day | ORAL | Status: DC
  Administered 2011-05-22 – 2011-05-28 (×7): 75 ug via ORAL
  Filled 2011-05-21 (×7): qty 1

## 2011-05-21 MED ORDER — SODIUM CHLORIDE 0.9 % IV SOLN
500.0000 mg | Freq: Two times a day (BID) | INTRAVENOUS | Status: DC
  Administered 2011-05-21 – 2011-05-24 (×7): 500 mg via INTRAVENOUS
  Filled 2011-05-21 (×9): qty 5

## 2011-05-21 MED ORDER — NEOSTIGMINE METHYLSULFATE 1 MG/ML IJ SOLN
INTRAMUSCULAR | Status: DC | PRN
  Administered 2011-05-21: 2 mg via INTRAVENOUS
  Administered 2011-05-21: 3 mg via INTRAVENOUS

## 2011-05-21 MED ORDER — HYDROCODONE-ACETAMINOPHEN 5-325 MG PO TABS
1.0000 | ORAL_TABLET | ORAL | Status: DC | PRN
  Administered 2011-05-24 – 2011-05-27 (×7): 1 via ORAL
  Filled 2011-05-21 (×7): qty 1

## 2011-05-21 MED ORDER — MORPHINE SULFATE 2 MG/ML IJ SOLN: 0.0500 mg/kg | INTRAMUSCULAR | Status: DC | PRN

## 2011-05-21 MED ORDER — ACETAMINOPHEN 325 MG PO TABS
650.0000 mg | ORAL_TABLET | ORAL | Status: DC | PRN
  Administered 2011-05-24: 650 mg via ORAL
  Filled 2011-05-21 (×3): qty 2

## 2011-05-21 MED ORDER — HEMOSTATIC AGENTS (NO CHARGE) OPTIME
TOPICAL | Status: DC | PRN
  Administered 2011-05-21: 1 via TOPICAL

## 2011-05-21 MED ORDER — HYDROMORPHONE HCL PF 1 MG/ML IJ SOLN: 0.2500 mg | INTRAMUSCULAR | Status: DC | PRN

## 2011-05-21 MED ORDER — SODIUM CHLORIDE 0.9 % IV SOLN
INTRAVENOUS | Status: AC
  Filled 2011-05-21: qty 500

## 2011-05-21 MED ORDER — ONDANSETRON HCL 4 MG/2ML IJ SOLN: 4.0000 mg | INTRAMUSCULAR | Status: DC | PRN

## 2011-05-21 MED ORDER — TIMOLOL MALEATE 0.25 % OP SOLN
1.0000 [drp] | Freq: Two times a day (BID) | OPHTHALMIC | Status: DC
  Administered 2011-05-21 – 2011-05-28 (×14): 1 [drp] via OPHTHALMIC
  Filled 2011-05-21: qty 5

## 2011-05-21 MED ORDER — PROMETHAZINE HCL 25 MG PO TABS: 12.5000 mg | ORAL_TABLET | ORAL | Status: DC | PRN

## 2011-05-21 MED ORDER — ROCURONIUM BROMIDE 100 MG/10ML IV SOLN
INTRAVENOUS | Status: DC | PRN
  Administered 2011-05-21: 50 mg via INTRAVENOUS

## 2011-05-21 MED ORDER — BIMATOPROST 0.03 % OP SOLN
1.0000 [drp] | Freq: Every day | OPHTHALMIC | Status: DC
  Administered 2011-05-21 – 2011-05-23 (×2): 1 [drp] via OPHTHALMIC
  Filled 2011-05-21 (×2): qty 2.5

## 2011-05-21 MED ORDER — PANTOPRAZOLE SODIUM 40 MG IV SOLR
40.0000 mg | Freq: Every day | INTRAVENOUS | Status: DC
  Administered 2011-05-21: 40 mg via INTRAVENOUS
  Filled 2011-05-21 (×2): qty 40

## 2011-05-21 MED ORDER — LIDOCAINE-EPINEPHRINE 1 %-1:100000 IJ SOLN
INTRAMUSCULAR | Status: DC | PRN
  Administered 2011-05-21: 5 mL

## 2011-05-21 MED ORDER — CEFAZOLIN SODIUM 1-5 GM-% IV SOLN
1.0000 g | Freq: Three times a day (TID) | INTRAVENOUS | Status: AC
  Administered 2011-05-21 – 2011-05-22 (×2): 1 g via INTRAVENOUS
  Filled 2011-05-21 (×2): qty 50

## 2011-05-21 MED ORDER — BUPIVACAINE HCL (PF) 0.5 % IJ SOLN
INTRAMUSCULAR | Status: DC | PRN
  Administered 2011-05-21: 5 mL

## 2011-05-21 MED ORDER — ONDANSETRON HCL 4 MG/2ML IJ SOLN
INTRAMUSCULAR | Status: DC | PRN
  Administered 2011-05-21: 4 mg via INTRAVENOUS

## 2011-05-21 MED ORDER — SODIUM CHLORIDE 0.9 % IR SOLN
Status: DC | PRN
  Administered 2011-05-21: 1000 mL

## 2011-05-21 MED ORDER — BACITRACIN 50000 UNITS IM SOLR
INTRAMUSCULAR | Status: AC
  Filled 2011-05-21: qty 1

## 2011-05-21 MED ORDER — LISINOPRIL 20 MG PO TABS
20.0000 mg | ORAL_TABLET | Freq: Every day | ORAL | Status: DC
  Administered 2011-05-22 – 2011-05-28 (×6): 20 mg via ORAL
  Filled 2011-05-21 (×7): qty 1

## 2011-05-21 MED ORDER — ONDANSETRON HCL 4 MG PO TABS: 4.0000 mg | ORAL_TABLET | ORAL | Status: DC | PRN

## 2011-05-21 MED ORDER — LABETALOL HCL 5 MG/ML IV SOLN: 10.0000 mg | INTRAVENOUS | Status: DC | PRN

## 2011-05-21 MED ORDER — GLYCOPYRROLATE 0.2 MG/ML IJ SOLN
INTRAMUSCULAR | Status: DC | PRN
  Administered 2011-05-21: 0.3 mg via INTRAVENOUS
  Administered 2011-05-21: 0.4 mg via INTRAVENOUS

## 2011-05-21 MED ORDER — TIMOLOL HEMIHYDRATE 0.25 % OP SOLN: 1.0000 [drp] | Freq: Two times a day (BID) | OPHTHALMIC | Status: DC

## 2011-05-21 MED ORDER — LIDOCAINE HCL (CARDIAC) 20 MG/ML IV SOLN
INTRAVENOUS | Status: DC | PRN
  Administered 2011-05-21: 80 mg via INTRAVENOUS

## 2011-05-21 MED ORDER — BUPIVACAINE-EPINEPHRINE 0.25% -1:200000 IJ SOLN
INTRAMUSCULAR | Status: DC | PRN
  Administered 2011-05-21: 3 mL

## 2011-05-21 MED ORDER — PHENYLEPHRINE HCL 10 MG/ML IJ SOLN
10.0000 mg | INTRAVENOUS | Status: DC | PRN
  Administered 2011-05-21: 10 ug/min via INTRAVENOUS

## 2011-05-21 MED ORDER — DOCUSATE SODIUM 100 MG PO CAPS
100.0000 mg | ORAL_CAPSULE | Freq: Two times a day (BID) | ORAL | Status: DC
  Administered 2011-05-21 – 2011-05-27 (×11): 100 mg via ORAL
  Filled 2011-05-21 (×10): qty 1

## 2011-05-21 MED ORDER — LACTATED RINGERS IV SOLN
INTRAVENOUS | Status: DC | PRN
  Administered 2011-05-21 (×2): via INTRAVENOUS

## 2011-05-21 MED ORDER — ACETAMINOPHEN 650 MG RE SUPP: 650.0000 mg | RECTAL | Status: DC | PRN

## 2011-05-21 MED ORDER — MORPHINE SULFATE 2 MG/ML IJ SOLN: 1.0000 mg | INTRAMUSCULAR | Status: DC | PRN

## 2011-05-21 MED ORDER — SIMVASTATIN 5 MG PO TABS
5.0000 mg | ORAL_TABLET | Freq: Every day | ORAL | Status: DC
  Administered 2011-05-21 – 2011-05-27 (×7): 5 mg via ORAL
  Filled 2011-05-21 (×8): qty 1

## 2011-05-21 MED ORDER — EPHEDRINE SULFATE 50 MG/ML IJ SOLN
INTRAMUSCULAR | Status: DC | PRN
  Administered 2011-05-21: 5 mg via INTRAVENOUS

## 2011-05-21 SURGICAL SUPPLY — 89 items
5MM BLADELESS TROCAR W/SLEEVE ×3 IMPLANT
5MM UNIVERSAL TROCAR SLEEVE ×6 IMPLANT
BAG DECANTER FOR FLEXI CONT (MISCELLANEOUS) ×6 IMPLANT
BENZOIN TINCTURE PRP APPL 2/3 (GAUZE/BANDAGES/DRESSINGS) ×3 IMPLANT
BLADE SURG 10 STRL SS (BLADE) ×6 IMPLANT
BLADE SURG 11 STRL SS (BLADE) ×3 IMPLANT
BLADE SURG 15 STRL LF DISP TIS (BLADE) ×2 IMPLANT
BLADE SURG 15 STRL SS (BLADE) ×1
BLADE SURG ROTATE 9660 (MISCELLANEOUS) ×6 IMPLANT
BOOT SUTURE AID YELLOW STND (SUTURE) ×3 IMPLANT
BRUSH SCRUB EZ PLAIN DRY (MISCELLANEOUS) ×6 IMPLANT
BUR ACORN 6.0 PRECISION (BURR) ×3 IMPLANT
CANISTER SUCTION 2500CC (MISCELLANEOUS) ×3 IMPLANT
CATH ROBINSON RED A/P 10FR (CATHETERS) ×3 IMPLANT
CLOTH BEACON ORANGE TIMEOUT ST (SAFETY) ×3 IMPLANT
CONT SPEC 4OZ CLIKSEAL STRL BL (MISCELLANEOUS) IMPLANT
CORDS BIPOLAR (ELECTRODE) ×3 IMPLANT
COVER MAYO STAND STRL (DRAPES) ×3 IMPLANT
COVER TABLE BACK 60X90 (DRAPES) ×6 IMPLANT
DECANTER SPIKE VIAL GLASS SM (MISCELLANEOUS) ×3 IMPLANT
DERMABOND ADVANCED (GAUZE/BANDAGES/DRESSINGS) ×1
DERMABOND ADVANCED .7 DNX12 (GAUZE/BANDAGES/DRESSINGS) ×2 IMPLANT
DRAPE INCISE IOBAN 85X60 (DRAPES) ×3 IMPLANT
DRAPE ORTHO SPLIT 77X108 STRL (DRAPES) ×1
DRAPE POUCH INSTRU U-SHP 10X18 (DRAPES) ×3 IMPLANT
DRAPE SURG ORHT 6 SPLT 77X108 (DRAPES) ×2 IMPLANT
DRESSING TELFA 8X3 (GAUZE/BANDAGES/DRESSINGS) ×3 IMPLANT
DRSG OPSITE 4X5.5 SM (GAUZE/BANDAGES/DRESSINGS) ×3 IMPLANT
DRSG TEGADERM 4X4.75 (GAUZE/BANDAGES/DRESSINGS) ×3 IMPLANT
ELECT CAUTERY BLADE 6.4 (BLADE) ×3 IMPLANT
ELECT REM PT RETURN 9FT ADLT (ELECTROSURGICAL) ×3
ELECTRODE REM PT RTRN 9FT ADLT (ELECTROSURGICAL) ×2 IMPLANT
GAUZE SPONGE 2X2 8PLY STRL LF (GAUZE/BANDAGES/DRESSINGS) IMPLANT
GAUZE SPONGE 4X4 16PLY XRAY LF (GAUZE/BANDAGES/DRESSINGS) ×6 IMPLANT
GLOVE BIO SURGEON STRL SZ7.5 (GLOVE) ×3 IMPLANT
GLOVE BIO SURGEON STRL SZ8 (GLOVE) ×6 IMPLANT
GLOVE BIOGEL PI IND STRL 8 (GLOVE) ×4 IMPLANT
GLOVE BIOGEL PI IND STRL 8.5 (GLOVE) ×2 IMPLANT
GLOVE BIOGEL PI INDICATOR 8 (GLOVE) ×2
GLOVE BIOGEL PI INDICATOR 8.5 (GLOVE) ×1
GLOVE ECLIPSE 7.5 STRL STRAW (GLOVE) ×3 IMPLANT
GLOVE ECLIPSE 8.0 STRL XLNG CF (GLOVE) ×3 IMPLANT
GLOVE EXAM NITRILE LRG STRL (GLOVE) IMPLANT
GLOVE EXAM NITRILE MD LF STRL (GLOVE) ×6 IMPLANT
GLOVE EXAM NITRILE XL STR (GLOVE) IMPLANT
GLOVE EXAM NITRILE XS STR PU (GLOVE) IMPLANT
GLOVE INDICATOR 7.0 STRL GRN (GLOVE) ×6 IMPLANT
GLOVE INDICATOR 7.5 STRL GRN (GLOVE) ×3 IMPLANT
GLOVE INDICATOR 8.0 STRL GRN (GLOVE) ×3 IMPLANT
GLOVE SURG SS PI 6.5 STRL IVOR (GLOVE) ×6 IMPLANT
GOWN BRE IMP SLV AUR LG STRL (GOWN DISPOSABLE) ×12 IMPLANT
GOWN BRE IMP SLV AUR XL STRL (GOWN DISPOSABLE) ×3 IMPLANT
GOWN STRL NON-REIN LRG LVL3 (GOWN DISPOSABLE) ×6 IMPLANT
GOWN STRL REIN 2XL LVL4 (GOWN DISPOSABLE) ×9 IMPLANT
HEMOSTAT SURGICEL 2X14 (HEMOSTASIS) IMPLANT
KIT BASIN OR (CUSTOM PROCEDURE TRAY) ×3 IMPLANT
KIT ROOM TURNOVER OR (KITS) ×3 IMPLANT
LAPAROSCOPIC TRAY LF ×3 IMPLANT
NEEDLE HYPO 25X1 1.5 SAFETY (NEEDLE) ×3 IMPLANT
NS IRRIG 1000ML POUR BTL (IV SOLUTION) ×3 IMPLANT
PACK EENT II TURBAN DRAPE (CUSTOM PROCEDURE TRAY) ×3 IMPLANT
PAD ARMBOARD 7.5X6 YLW CONV (MISCELLANEOUS) ×9 IMPLANT
PATTIES SURGICAL .5 X.5 (GAUZE/BANDAGES/DRESSINGS) IMPLANT
PEEL AWAY INTRODUCER SET ×3 IMPLANT
PENCIL BUTTON HOLSTER BLD 10FT (ELECTRODE) ×3 IMPLANT
SHEATH PERITONEAL INTRO 46 (MISCELLANEOUS) IMPLANT
SHEATH PERITONEAL INTRO 61 (MISCELLANEOUS) IMPLANT
SPONGE GAUZE 2X2 STER 10/PKG (GAUZE/BANDAGES/DRESSINGS)
SPONGE GAUZE 4X4 12PLY (GAUZE/BANDAGES/DRESSINGS) ×6 IMPLANT
SPONGE LAP 4X18 X RAY DECT (DISPOSABLE) ×3 IMPLANT
SPONGE SURGIFOAM ABS GEL 100 (HEMOSTASIS) IMPLANT
STAPLER SKIN PROX WIDE 3.9 (STAPLE) ×3 IMPLANT
STRIP CLOSURE SKIN 1/2X4 (GAUZE/BANDAGES/DRESSINGS) ×3 IMPLANT
SUT BONE WAX W31G (SUTURE) IMPLANT
SUT ETHILON 3 0 FSL (SUTURE) ×3 IMPLANT
SUT ETHILON 3 0 PS 1 (SUTURE) IMPLANT
SUT MNCRL AB 4-0 PS2 18 (SUTURE) ×3 IMPLANT
SUT NURALON 4 0 TR CR/8 (SUTURE) IMPLANT
SUT SILK 0 TIES 10X30 (SUTURE) ×3 IMPLANT
SUT SILK 2 0 FS (SUTURE) ×3 IMPLANT
SUT SILK 2 0 TIES 10X30 (SUTURE) ×3 IMPLANT
SUT VIC AB 2-0 CP2 18 (SUTURE) ×3 IMPLANT
SUT VIC AB 3-0 SH 8-18 (SUTURE) ×3 IMPLANT
SYR BULB 3OZ (MISCELLANEOUS) ×3 IMPLANT
SYR CONTROL 10ML LL (SYRINGE) ×3 IMPLANT
TOWEL OR 17X26 10 PK STRL BLUE (TOWEL DISPOSABLE) ×6 IMPLANT
TUBE CONNECTING 12X1/4 (SUCTIONS) ×3 IMPLANT
VALVE PROGRAM W DISTAL CATH (Prosthesis & Implant Heart) ×3 IMPLANT
WATER STERILE IRR 1000ML POUR (IV SOLUTION) ×3 IMPLANT

## 2011-05-21 NOTE — Preoperative (Signed)
Beta Blockers   Reason not to administer Beta Blockers:Not Applicable 

## 2011-05-21 NOTE — Op Note (Signed)
05/21/2011  9:13 AM  PATIENT:  Linda Davila  78 y.o. female  PRE-OPERATIVE DIAGNOSIS:  normal pressure hydrocephalus  POST-OPERATIVE DIAGNOSIS:  normal pressure hydrocephalus  PROCEDURE:  Procedure(s) (LRB): SHUNT INSERTION VENTRICULAR-PERITONEAL (N/A) LAPAROSCOPIC REVISION VENTRICULAR-PERITONEAL (V-P) SHUNT (N/A)  SURGEON:  Surgeon(s) and Role: Panel 1:    * Maeola Harman, MD - Primary  Panel 2:    * Adolph Pollack, MD - Primary  PHYSICIAN ASSISTANT:   ASSISTANTS: none   ANESTHESIA:   general  EBL:  Total I/O In: 1000 [I.V.:1000] Out: -   BLOOD ADMINISTERED:none  DRAINS: none   LOCAL MEDICATIONS USED:  LIDOCAINE   SPECIMEN:  No Specimen  DISPOSITION OF SPECIMEN:  N/A  COUNTS:  YES  TOURNIQUET:  * No tourniquets in log *  DICTATION:   Indications: 78 year old female with symptomatic normal pressure hydrocephalus who improved markedly following high volume lumbar puncture and it was elected to take her to surgery for ventriculoperitoneal shunt placement. She has had multiple prior abdominal surgeries.  It was elected for patient to have laparoscopic assisted abdominal catheter placement.  Procedure:  Patient was brought to the operating room and underwent smooth and uncomplicated induction of general endotracheal anesthesia.  Her right scalp, chest and abdomen were shaved, prepped and draped in usual fashion.  Right frontal scalp was infiltrated with lidocaine and an incision was made over coronal suture at mid-pupillary line.  Trephine was created, dura was incised with electrocautery.  Shunt was passed to abdomen with a posterior auricular intervening incision.  Tunneler was utilized with cautious placement, given bilateral breast implants.  Ventricular catheter was placed, requiring several passes, with brisk flow of CSF. Catheter was hooked to valve and anchored with a silk tie. Catheter had spontaneous flow of CSF.  Catheter was placed in abdominal cavity and  incisions were closed by Dr. Abbey Chatters.  Cranial incisions were closed with 2-0 vicryl sutures and 3-0 Nylon sutures.  Sterile occlusive dressings were placed.  Patient was extubated and taken to recovery in stable condition having tolerated procedure well.  Counts were correct at the end of the case.  PLAN OF CARE: Admit to inpatient   PATIENT DISPOSITION:  PACU - hemodynamically stable.   Delay start of Pharmacological VTE agent (>24hrs) due to surgical blood loss or risk of bleeding: yes

## 2011-05-21 NOTE — H&P (Signed)
NEUROSURGICAL CONSULTATION  Linda Davila   #[id number]  DOB:  03/30/1931  April 21, 2011  HISTORY:     Linda Davila is a 78 year old retired woman for whom neurosurgical consultation was requested by Dr. Cristopher Peru for consideration of normal pressure hydrocephalus.  She comes with her daughter today and did note that she is having difficulty with her memory and it is responding more slowly when discussing things.  She developed urinary urgency and they have noted a progressively worsening gait disorder since the summer of 2011.  They describe that she is barely lifting her feet and has a shuffling gait.  In addition, Linda Davila has had some back pain since a fall two weeks ago and has noted this in both of her legs.  They say that her problems began after she fell in the middle of 2011 but gait problems were significantly worse as of Toy of 2012 and they says she has had balance, incontinence and memory issues for about a week.    REVIEW OF SYSTEMS:   A detailed Review of Systems sheet was reviewed with the patient.  Pertinent positives include glasses, glaucoma, cataract removal bilaterally, balance disturbance, nasal congestion drainage, high blood pressure, heart murmur, mitral valve prolapse, high cholesterol, swelling in feet or hands, constipation, incontinence, leg weakness, problems with memory, problems with coordination in arms or legs, thyroid disease, excessive thirst or urination, and inhalant allergies.    PAST MEDICAL HISTORY:      Current Medical Conditions:    Past Medical History additionally significant for hypertension, hypothyroidism, glaucoma and elevated cholesterol.    Prior Operations and Hospitalizations:   Significant for bilateral mastectomy in 1987 with ductal hyperplasia with suspicious areas.  She underwent implant placement after that.  She has had a cholecystectomy in 1985, total vaginal hysterectomy in 1979, appendectomy 1950 and saphenous vein surgery at age 78.       Medications and Allergies:  Synthroid .075 mg. q.d., Lisinopril 20 mg. q.d, Timolol eye drops right eye b.i.d., Lumigan both eyes q.d., Lovastatin 20 mg. q.d., over-the-counter Refresh Tears, Claritin, Vitamin D.  She is allergic to "eye drops".    Height and Weight:     She is currently 5'4" tall, 139 pounds.     SOCIAL HISTORY:    She denies tobacco, alcohol or drug use.     DIAGNOSTIC STUDIES:   I had the opportunity to review an MRI of her cervical spine which is fairly unremarkable apart from multilevel degenerative changes.  MRI of the brain showed ventricular enlargement but hydrocephalus may be secondary ex facto changes but could not exclude normal pressure hydrocephalus.    PHYSICAL EXAMINATION:      General Appearance:   On examination today, Linda Davila is a pleasant and cooperative woman in no acute distress.     Blood Pressure, Pulse:     124/84, heart rate 74 and regular, respirations 18.      HEENT - normocephalic, atraumatic.  The pupils are equal, round and reactive to light.  The extraocular muscles are intact.  Sclerae - white.  Conjunctiva - pink.  Oropharynx benign.  Uvula midline.     Neck - there are no masses, meningismus, deformities, tracheal deviation, jugular vein distention or carotid bruits.  There is normal cervical range of motion.  Spurlings' test is negative without reproducible radicular pain turning the patient's head to either side.  Lhermitte's sign is not present with axial compression.      Respiratory -  there is normal respiratory effort with good intercostal function.  Lungs are clear to auscultation.  There are no rales, rhonchi or wheezes.      Cardiovascular - the heart has regular rate and rhythm to auscultation.  No murmurs are appreciated.  There is no extremity edema, cyanosis or clubbing.  There are palpable pedal pulses.      Abdomen - soft, nontender, no hepatosplenomegaly appreciated or masses.  There are active bowel sounds.  No  guarding or rebound.      Musculoskeletal Examination - Mrs. Feagan gets up slowly from her chair.  She walks with what appears to be a magnetic gait.  She has to use a walker.  She turns very slowly.  She has a negative Romberg test.    NEUROLOGICAL EXAMINATION: The patient is oriented to time, person and place and has good recall of both recent and remote memory with normal attention span and concentration.  The patient speaks with clear and fluent speech and exhibits normal language function and appropriate fund of knowledge.      Cranial Nerve Examination - pupils are equal, round and reactive to light. Previous cataract surgery.  Extraocular movements are full.  Visual fields are full to confrontational testing.  Facial sensation and facial movement are symmetric and intact.  Hearing is intact to finger rub.  Palate is upgoing.  Shoulder shrug is symmetric.  Tongue protrudes in the midline.      Motor Examination - motor strength is 5/5 in the bilateral deltoids, biceps, triceps, handgrips, wrist extensors, interosseous.  In the lower extremities motor strength is 5/5 in hip flexion, extension, quadriceps, hamstrings, plantar flexion, dorsiflexion and extensor hallucis longus.      Sensory Examination - normal to light touch and pinprick sensation in the upper and lower extremities.     Deep Tendon Reflexes - 2 in the biceps, triceps, and brachioradialis, 2 in the knees, 2 in the ankles.  The great toes are downgoing to plantar stimulation.  No pathologic reflexes.       Cerebellar Examination - normal coordination in upper and lower extremities and normal rapid alternating movements.  Romberg test is negative.    IMPRESSION AND RECOMMENDATIONS: Linda Davila is a 78 year old woman who has imaging, physical examination and history all consistent with normal pressure hydrocephalus.  She has developed incontinence.  She has begun to develop signs of dementia and she has a longer standing gait  disorder which appears consistent with magnetic gait.  I discussed these findings in detail with the patient and her daughter and advised that we could do a high volume lumbar puncture as the next maneuver to see if this was consistent with normal pressure hydrocephalus and that if she responded well to this I would then recommend pursuing ventriculoperitoneal shunt placement.  We had a lengthy discussion about the details of shunt surgery as well as the spinal tap and I would recommend that we get physical therapy assessment prior to and after the spinal tap.  They wish to pursue this and the plan is to do this at the hospital with physical therapy assessment prior to lumbar puncture, followed by lumbar puncture, followed by repeat physical therapy assessment.  I will make further recommendations after we do that depending on what kind of response we get from that.      [facility]   Danae Orleans. Venetia Maxon, M.D.  JDS:gde  cc: Dr. Cristopher Peru Dr. Daniel Nones

## 2011-05-21 NOTE — H&P (Signed)
Notes scanned to Epic

## 2011-05-21 NOTE — Anesthesia Preprocedure Evaluation (Addendum)
Anesthesia Evaluation  Patient identified by MRN, date of birth, ID band  Reviewed: Allergy & Precautions, H&P , NPO status , Patient's Chart, lab work & pertinent test results  History of Anesthesia Complications (+) PONV  Airway Mallampati: II      Dental   Pulmonary neg pulmonary ROS,  breath sounds clear to auscultation        Cardiovascular hypertension, Pt. on medications + Valvular Problems/Murmurs Rhythm:Regular Rate:Normal     Neuro/Psych negative neurological ROS  negative psych ROS   GI/Hepatic negative GI ROS, Neg liver ROS,   Endo/Other  Hypothyroidism   Renal/GU negative Renal ROS     Musculoskeletal   Abdominal   Peds  Hematology negative hematology ROS (+)   Anesthesia Other Findings   Reproductive/Obstetrics                          Anesthesia Physical Anesthesia Plan  ASA: III  Anesthesia Plan: General   Post-op Pain Management:    Induction: Intravenous  Airway Management Planned: Oral ETT  Additional Equipment:   Intra-op Plan:   Post-operative Plan: Extubation in OR  Informed Consent: I have reviewed the patients History and Physical, chart, labs and discussed the procedure including the risks, benefits and alternatives for the proposed anesthesia with the patient or authorized representative who has indicated his/her understanding and acceptance.   Dental advisory given  Plan Discussed with: Anesthesiologist, Surgeon and CRNA  Anesthesia Plan Comments:        Anesthesia Quick Evaluation

## 2011-05-21 NOTE — Interval H&P Note (Signed)
History and Physical Interval Note:  05/21/2011 7:30 AM  Linda Davila  has presented today for surgery, with the diagnosis of normal pressure hydrocephalus  The various methods of treatment have been discussed with the patient and family. After consideration of risks, benefits and other options for treatment, the patient has consented to  Procedure(s) (LRB): SHUNT INSERTION VENTRICULAR-PERITONEAL (N/A) LAPAROSCOPIC REVISION VENTRICULAR-PERITONEAL (V-P) SHUNT (N/A) as a surgical intervention .  The patients' history has been reviewed, patient examined, no change in status, stable for surgery.  I have reviewed the patients' chart and labs.  Questions were answered to the patient's satisfaction.     Tyeler Goedken D  Date of Initial H&P: 04/21/2011  History reviewed, patient examined, no change in status, stable for surgery.

## 2011-05-21 NOTE — Progress Notes (Signed)
CT reviewed, which shows posteriorly positioned catheter within ventricle with tract through right caudate nucleus.  No hemorrhage.  Patient has facial droop.  Will monitor in ICU.  Situation discussed with patient's family.

## 2011-05-21 NOTE — Progress Notes (Addendum)
05/21/2011 10:48   Pt arrived to ICU with left lower facial droop. Dr. Franky Macho notified. CT head ordered STAT. CT notified, awaiting transport.   11:15, contacted CT again for help with transport. Pt's pupils nonreactive, R round, L irregular shaped. MD aware.   Lennie Muckle Leigh    11:55 Pt back from CT, Dr. Venetia Maxon aware of CT results and saw pt at bedside. Aware of pupil shapes & nonreaction, and aware of facial droop. No orders received. Will continue to monitor.   Holly Bodily

## 2011-05-21 NOTE — Progress Notes (Signed)
05/21/2011 17:20  Pt pivoted to [facility] w/ two person assist. Upon sitting up, pt noted to be leaning towards the left side (also has PTA weakness in LLE). Pt reported that PTA she used a wheelchair and transferred independently by holding onto a bar for support and has been weak since a fall in March. MD notified regarding left sided lean and mobility concerns. PT/OT orders placed. Family also requested that pt receive a CIR and CSW consult. Order placed for CSW consult and physician note written to request CIR when pt is ready for rehab.   Holly Bodily

## 2011-05-21 NOTE — Anesthesia Postprocedure Evaluation (Signed)
  Anesthesia Post-op Note  Patient: Linda Davila  Procedure(s) Performed: Procedure(s) (LRB): SHUNT INSERTION VENTRICULAR-PERITONEAL (N/A) LAPAROSCOPIC REVISION VENTRICULAR-PERITONEAL (V-P) SHUNT (N/A)  Patient Location: PACU  Anesthesia Type: General  Level of Consciousness: awake  Airway and Oxygen Therapy: Patient Spontanous Breathing  Post-op Pain: mild  Post-op Assessment: Post-op Vital signs reviewed  Post-op Vital Signs: Reviewed  Complications: No apparent anesthesia complications

## 2011-05-21 NOTE — Op Note (Signed)
Operative Note  Kjirsten H Ruelas female 78 y.o. 05/21/2011  PREOPERATIVE DX:  Nelva Bush Pressure Hydrocephalus (NPH)  POSTOPERATIVE DX:  Same  PROCEDURE:  Laparoscopic assisted ventriculoperitoneal shunt placement         Surgeon: Adolph Pollack   Co-surgeon:  Maeola Harman  Anesthesia: General endotracheal anesthesia  Indications: This is a 78 year old female who was found to have normal pressure hydrocephalus based on neurologic symptoms she developed. She now presents for the above procedure.    Procedure Detail:  She was brought to the operating room placed on the operating table and a general anesthetic was administered. She was appropriately positioned. The right scalp the neck and chest and abdomen were widely sterilely prepped and draped.  She was placed in slight reverse Trendelenburg position. Marcaine was infiltrated in the left subcostal area. A 5 mm incision was made in the left subcostal area. Using a 5 mm Optiview trocar and 5 mm laparoscope access was gained into the peritoneal cavity and a pneumoperitoneum was created. The area under the trocar was inspected and there is no evidence of bleeding or organ injury.  There were no upper abdominal adhesions present. There were some right lower quadrant adhesions present at the site of her previous appendectomy scar.  A second 5 mm trocar was placed inferior to the first under laparoscopic vision.  The shunt catheter was then tunneled from the head and neck area down to the right upper quadrant and a small incision was made in a previous right subcostal scar and the catheter brought out through this 5-6 mm incision. A 16-gauge needle was then placed through this incision and into the abdominal cavity under laparoscopic guidance. A wire was then placed through the needle and the needle removed. The incision was then dilated with the dilator peel-away sheath device. A 5 mm trocar was then placed through this incision and removed.  The peel away sheath dilator device and placed through that incision into the peritoneal cavity under laparoscopic vision. Adequate flow was noted to be coming from the ventriculoperitoneal shunt catheter.  The dilator was removed and the catheter was threaded through the peel-away sheath introducer. The catheter was then grasped intraperitoneally and placed in the right lower quadrant. The peel-away sheath was removed.  A 4 quadrant inspection was then performed and there is no evidence of bleeding or organ injury. The trochars were removed and the pneumoperitoneum released.  The skin incisions were then closed with 4-0 Monocryl subcuticular stitches. Steri-Strips and sterile dressings were applied. She tolerated the procedure well without any apparent complications and was taken to the recovery room in satisfactory condition.   Estimated Blood Loss:  Minimal         Blood Given: none          Specimens: none        Complications:  * No complications entered in OR log *         Disposition: PACU - hemodynamically stable.         Condition: stable

## 2011-05-21 NOTE — Transfer of Care (Signed)
Immediate Anesthesia Transfer of Care Note  Patient: Linda Davila  Procedure(s) Performed: Procedure(s) (LRB): SHUNT INSERTION VENTRICULAR-PERITONEAL (N/A) LAPAROSCOPIC REVISION VENTRICULAR-PERITONEAL (V-P) SHUNT (N/A)  Patient Location: PACU  Anesthesia Type: General  Level of Consciousness: awake and oriented  Airway & Oxygen Therapy: Patient Spontanous Breathing and Patient connected to nasal cannula oxygen  Post-op Assessment: Report given to PACU RN, Post -op Vital signs reviewed and stable and Patient moving all extremities  Post vital signs: Reviewed and stable  Complications: No apparent anesthesia complications

## 2011-05-21 NOTE — Transfer of Care (Signed)
Immediate Anesthesia Transfer of Care Note  Patient: Linda Davila  Procedure(s) Performed: Procedure(s) (LRB): SHUNT INSERTION VENTRICULAR-PERITONEAL (N/A) LAPAROSCOPIC REVISION VENTRICULAR-PERITONEAL (V-P) SHUNT (N/A)  Patient Location: PACU  Anesthesia Type: General  Level of Consciousness: awake and oriented  Airway & Oxygen Therapy: Patient Spontanous Breathing and Patient connected to nasal cannula oxygen  Post-op Assessment: Report given to PACU RN and Post -op Vital signs reviewed and stable  Post vital signs: Reviewed and stable  Complications: No apparent anesthesia complications

## 2011-05-22 MED ORDER — PANTOPRAZOLE SODIUM 40 MG PO TBEC
40.0000 mg | DELAYED_RELEASE_TABLET | Freq: Every day | ORAL | Status: DC
  Administered 2011-05-22 – 2011-05-27 (×6): 40 mg via ORAL
  Filled 2011-05-22 (×6): qty 1

## 2011-05-22 NOTE — Progress Notes (Signed)
Patient ID: Linda Davila, female   DOB: [birth year], 78 y.o.   MRN: [id number] Subjective:  The patient is alert and pleasant. She looks well. She denies headaches or pain.  Objective: Vital signs in last 24 hours: Temp:  [95.7 F (35.4 C)-99.2 F (37.3 C)] 98.8 F (37.1 C) (04/13 0400) Pulse Rate:  [77-99] 82  (04/13 0700) Resp:  [11-37] 14  (04/13 0700) BP: (97-157)/(45-96) 122/65 mmHg (04/13 0700) SpO2:  [97 %-100 %] 99 % (04/13 0700) Weight:  [63.7 kg (140 lb 6.9 oz)] 63.7 kg (140 lb 6.9 oz) (04/13 0300)  Intake/Output from previous day: 04/12 0701 - 04/13 0700 In: 2959 [I.V.:2450; IV Piggyback:509] Out: 1525 [Urine:1475; Blood:50] Intake/Output this shift:    Physical exam the patient is alert and oriented x3. She is moving all 4 extremities well. I don't note any facial droop today.  Lab Results: No results found for this basename: WBC:2,HGB:2,HCT:2,PLT:2 in the last 72 hours BMET No results found for this basename: NA:2,K:2,CL:2,CO2:2,GLUCOSE:2,BUN:2,CREATININE:2,CALCIUM:2 in the last 72 hours  Studies/Results: Ct Head Wo Contrast  05/21/2011  *RADIOLOGY REPORT*  Clinical Data: Shunt placement today.  Left facial droop.  CT HEAD WITHOUT CONTRAST  Technique:  Contiguous axial images were obtained from the base of the skull through the vertex without contrast.  Comparison: None.  Findings: Right frontal ventricular shunt catheter has been placed. The catheter goes through the internal capsule and caudate nucleus and anterior thalamus.  Catheter and appears to go just underneath the lateral ventricle and into the third ventricle and then passes through the third ventricle into the cistern lateral to the pineal gland.  There is a high density hemorrhage in the right putamen, right internal capsule and right thalamus which is posterior to the shunt catheter and may be a needle tract.  Mild to moderate ventricular enlargement.  No baseline studies are available for comparison.  Third  and  lateral ventricles are dilated.  No ventricular hemorrhage is identified.  There is generalized atrophy.  Chronic microvascular ischemia in the white matter. Mild pneumocephalus with subdural gas anteriorly on the right.  Shunt tubing extends into the right posterior neck.  IMPRESSION: Shunt catheter appears to go through the right anterior limb internal capsule and right anterior thalamus and passes below the lateral ventricle into the third ventricle with the tip located to the left of the pineal gland.  Acute hemorrhage posterior to the shunt tubing in the right basal ganglia and thalamus, most likely a needle tract.  Mild to moderate ventricular dilatation.  Original Report Authenticated By: Camelia Phenes, M.D.    Assessment/Plan: Postop day 1: The patient's left-sided weakness is already resolving. We will monitor her in the ICU likely until tomorrow. We will mobilize her with PT and OT.  LOS: 1 day     [name] D 05/22/2011, 7:30 AM

## 2011-05-22 NOTE — Progress Notes (Signed)
Patient examined and I agree with the assessment and plan  Violeta Gelinas, MD, MPH, FACS Pager: [phone]  05/22/2011 3:55 PM

## 2011-05-22 NOTE — Evaluation (Signed)
Physical Therapy Evaluation Patient Details Name: Linda Davila MRN: [id number] DOB: [birth year] Today's Date: 05/22/2011  Problem List:  Patient Active Problem List  Diagnoses  . Weakness of both legs  . NPH (normal pressure hydrocephalus)  . Urinary incontinence without sensory awareness  . Gait abnormality  . Cognitive and behavioral changes  . Hypertension  . Hyperlipidemia  . Glaucoma    Past Medical History:  Past Medical History  Diagnosis Date  . Hypertension   . Hypercholesteremia   . Thyroid disease   . Vertigo   . Cancer     skin/ on back  . Heart murmur   . PONV (postoperative nausea and vomiting)     N/V after Breast surgery  . Hypothyroidism     takes Synthroid   Past Surgical History:  Past Surgical History  Procedure Date  . Cholecystectomy   . Appendectomy   . Mastectomy   . Abdominal hysterectomy   . Hemorroidectomy   . Bilateral veins stripping 1968   . Eye surgery     cataracts removed both eyes    PT Assessment/Plan/Recommendation PT Assessment Clinical Impression Statement: Pt presents s/p V-P shunt with balance deficits and L weakness. Pt with left lateral lean during all movements and static positions placing pt at a large fall risk. Pt will benefit from skilled PT in the acute care setting to improve balance and increase strength.  PT Recommendation/Assessment: Patient will need skilled PT in the acute care venue PT Problem List: Decreased strength;Decreased knowledge of use of DME;Decreased balance;Decreased mobility PT Therapy Diagnosis : Abnormality of gait;Generalized weakness PT Plan PT Frequency: Min 4X/week PT Treatment/Interventions: Gait training;DME instruction;Functional mobility training;Patient/family education;Therapeutic activities;Therapeutic exercise;Balance training PT Recommendation Recommendations for Other Services: Rehab consult Follow Up Recommendations: Inpatient Rehab;Supervision/Assistance - 24 hour Equipment  Recommended: Defer to next venue PT Goals  Acute Rehab PT Goals PT Goal Formulation: With patient Time For Goal Achievement: 2 weeks Pt will go Sit to Stand: with supervision PT Goal: Sit to Stand - Progress: Goal set today Pt will go Stand to Sit: with supervision PT Goal: Stand to Sit - Progress: Goal set today Pt will Ambulate: 51 - 150 feet;with supervision;with least restrictive assistive device PT Goal: Ambulate - Progress: Goal set today  PT Evaluation Precautions/Restrictions  Precautions Precautions: Fall Restrictions Weight Bearing Restrictions: No Prior Functioning  Home Living Lives With: Alone Available Help at Discharge: Available PRN/intermittently;Family (daughter) Type of Home: House Home Access: Stairs to enter Secretary/administrator of Steps: 3 Entrance Stairs-Rails: Right Home Layout: One level Bathroom Shower/Tub: Forensic scientist: Standard Bathroom Accessibility: Yes How Accessible: Accessible via walker Home Adaptive Equipment: Straight cane Prior Function Level of Independence: Independent Able to Take Stairs?: Yes Driving: No (not recently) Vocation: Retired Financial risk analyst Arousal/Alertness: Awake/alert Overall Cognitive Status: Appears within functional limits for tasks assessed Orientation Level: Oriented to person;Oriented to time;Oriented to situation Sensation/Coordination Sensation Light Touch: Appears Intact Coordination Gross Motor Movements are Fluid and Coordinated: Yes Extremity Assessment RLE Assessment RLE Assessment: Within Functional Limits LLE Assessment LLE Assessment: Exceptions to [facility] LLE Strength LLE Overall Strength: Deficits LLE Overall Strength Comments: Overall 3+-4/5 Mobility (including Balance) Bed Mobility Bed Mobility: Yes Supine to Sit: 4: Min assist Supine to Sit Details (indicate cue type and reason): Min assist with trunk control secondary to L weakness and L lean Sitting -  Scoot to Edge of Bed: 6: Modified independent (Device/Increase time) Transfers Transfers: Yes Sit to Stand: 4: Min assist;With armrests;With upper extremity  assist;From bed Sit to Stand Details (indicate cue type and reason): VC for hand placement. Assist for stability secondary to balance deficits Stand to Sit: To chair/3-in-1;4: Min assist;With armrests;With upper extremity assist Stand to Sit Details: VC for hand placement. Pt able to control descent Stand Pivot Transfers: 3: Mod assist Stand Pivot Transfer Details (indicate cue type and reason): Transfer from straight chair to recliner. Mod assist for stability secondary to L lean. VC for sequencing with RW Ambulation/Gait Ambulation/Gait: Yes Ambulation/Gait Assistance: 2: Max assist Ambulation/Gait Assistance Details (indicate cue type and reason): Max assist with RW. Pt taking small steps. VC throughout for sequencing for safety. Ambulation Distance (Feet): 20 Feet (to/from bathroom) Assistive device: Rolling walker Gait Pattern: Shuffle;Trunk flexed;Decreased hip/knee flexion - right;Decreased hip/knee flexion - left  Balance Balance Assessed: Yes Static Sitting Balance Static Sitting - Balance Support: Bilateral upper extremity supported;Feet supported Static Sitting - Level of Assistance: 3: Mod assist Static Sitting - Comment/# of Minutes: Mod assist for left posterior lean. With verbal cues, pt was able to correct posture but still felt as though she was leaning towards the right when she was towards the left side Static Standing Balance Static Standing - Balance Support: Bilateral upper extremity supported Static Standing - Level of Assistance: 2: Max assist Static Standing - Comment/# of Minutes: Max assist secondary to left lean. Tactile cues through right hip, pt able to correct posture slightly but required max assist for safety due to lean Exercise    End of Session PT - End of Session Equipment Utilized During  Treatment: Gait belt Activity Tolerance: Patient limited by fatigue Patient left: in chair;with call bell in reach Nurse Communication: Mobility status for ambulation;Mobility status for transfers General Behavior During Session: [facility] for tasks performed Cognition: [facility] for tasks performed  Milana Kidney 05/22/2011, 9:58 AM  05/22/2011 Milana Kidney DPT PAGER: [phone] OFFICE: [phone]

## 2011-05-22 NOTE — Progress Notes (Signed)
1 Day Post-Op  Subjective: Pt reports she is doing well after VP shunt  She denies any pain in abd  Objective: Vital signs in last 24 hours: Temp:  [96.8 F (36 C)-99.2 F (37.3 C)] 98.7 F (37.1 C) (04/13 0800) Pulse Rate:  [77-100] 93  (04/13 1000) Resp:  [11-19] 16  (04/13 1000) BP: (97-157)/(45-96) 125/54 mmHg (04/13 1000) SpO2:  [97 %-100 %] 99 % (04/13 1000) Weight:  [63.7 kg (140 lb 6.9 oz)] 63.7 kg (140 lb 6.9 oz) (04/13 0300)    Intake/Output from previous day: 04/12 0701 - 04/13 0700 In: 3034 [I.V.:2525; IV Piggyback:509] Out: 1525 [Urine:1475; Blood:50] Intake/Output this shift: Total I/O In: 225 [I.V.:225] Out: -   General appearance: cooperative, appears stated age, no distress, slowed mentation and sleepy but arouses to voice GI: +BS, soft, op site/gauze dressing without drainage. Non tender, non distended  Lab Results:  No results found for this basename: WBC:2,HGB:2,HCT:2,PLT:2 in the last 72 hours BMET No results found for this basename: NA:2,K:2,CL:2,CO2:2,GLUCOSE:2,BUN:2,CREATININE:2,CALCIUM:2 in the last 72 hours PT/INR No results found for this basename: LABPROT:2,INR:2 in the last 72 hours ABG No results found for this basename: PHART:2,PCO2:2,PO2:2,HCO3:2 in the last 72 hours  Studies/Results: Ct Head Wo Contrast  05/21/2011  *RADIOLOGY REPORT*  Clinical Data: Shunt placement today.  Left facial droop.  CT HEAD WITHOUT CONTRAST  Technique:  Contiguous axial images were obtained from the base of the skull through the vertex without contrast.  Comparison: None.  Findings: Right frontal ventricular shunt catheter has been placed. The catheter goes through the internal capsule and caudate nucleus and anterior thalamus.  Catheter and appears to go just underneath the lateral ventricle and into the third ventricle and then passes through the third ventricle into the cistern lateral to the pineal gland.  There is a high density hemorrhage in the right putamen,  right internal capsule and right thalamus which is posterior to the shunt catheter and may be a needle tract.  Mild to moderate ventricular enlargement.  No baseline studies are available for comparison.  Third and  lateral ventricles are dilated.  No ventricular hemorrhage is identified.  There is generalized atrophy.  Chronic microvascular ischemia in the white matter. Mild pneumocephalus with subdural gas anteriorly on the right.  Shunt tubing extends into the right posterior neck.  IMPRESSION: Shunt catheter appears to go through the right anterior limb internal capsule and right anterior thalamus and passes below the lateral ventricle into the third ventricle with the tip located to the left of the pineal gland.  Acute hemorrhage posterior to the shunt tubing in the right basal ganglia and thalamus, most likely a needle tract.  Mild to moderate ventricular dilatation.  Original Report Authenticated By: Camelia Phenes, M.D.    Anti-infectives: Anti-infectives     Start     Dose/Rate Route Frequency Ordered Stop   05/21/11 1600   ceFAZolin (ANCEF) IVPB 1 g/50 mL premix        1 g 100 mL/hr over 30 Minutes Intravenous Every 8 hours 05/21/11 1110 05/22/11 0054   05/21/11 0849   bacitracin [id number] UNITS injection  Status:  Discontinued     Comments: RATCLIFF, ESTHER: cabinet override         05/21/11 0849 05/21/11 0942   05/21/11 0744   bacitracin [id number] UNITS injection     Comments: RATCLIFF, ESTHER: cabinet override         05/21/11 0744 05/21/11 1944   05/21/11 0715   bacitracin [id number]  UNITS injection     Comments: RATCLIFF, ESTHER: cabinet override         05/21/11 0715 05/21/11 1914   05/21/11 0000   ceFAZolin (ANCEF) IVPB 2 g/50 mL premix        2 g 100 mL/hr over 30 Minutes Intravenous 60 min pre-op 05/20/11 1423 05/21/11 0743          Assessment/Plan: s/p Procedure(s) (LRB): SHUNT INSERTION VENTRICULAR-PERITONEAL (N/A) LAPAROSCOPIC REVISION VENTRICULAR-PERITONEAL (V-P) SHUNT  (N/A) S/P assist with VPshunt Pt doing well. Advance po's as appropriate  LOS: 1 day   Aoki Wedemeyer,PA-C Pager [phone] General Trauma Pager [phone]

## 2011-05-23 NOTE — Progress Notes (Signed)
Physical Therapy Treatment Patient Details Name: Jalexis CARLOTTA TELFAIR MRN: [id number] DOB: 1930/10/16 Today's Date: 05/23/2011  PT Assessment/Plan  PT - Assessment/Plan Comments on Treatment Session: Pt still at a significant fall risk secondary to left lateral lean at all times requriing max cueing to correct positioning as well as physical assist at all times to prevent falls. Pt would be a great CIR candidate PT Plan: Frequency remains appropriate;Discharge plan needs to be updated PT Frequency: Min 4X/week Recommendations for Other Services: Rehab consult Follow Up Recommendations: Inpatient Rehab;Supervision/Assistance - 24 hour Equipment Recommended: Defer to next venue PT Goals  Acute Rehab PT Goals PT Goal Formulation: With patient PT Goal: Sit to Stand - Progress: Progressing toward goal PT Goal: Stand to Sit - Progress: Progressing toward goal PT Goal: Ambulate - Progress: Progressing toward goal  PT Treatment Precautions/Restrictions  Precautions Precautions: Fall Restrictions Weight Bearing Restrictions: No Mobility (including Balance) Bed Mobility Bed Mobility: No Transfers Transfers: Yes Sit to Stand: 4: Min assist;With armrests;With upper extremity assist;From bed;From chair/3-in-1 Sit to Stand Details (indicate cue type and reason): Min assist for stability secondary to balance loss and left lean Stand to Sit: To chair/3-in-1;4: Min assist;With armrests;With upper extremity assist Stand to Sit Details: Mina ssist for controlled descent. VC for hand placement for safety Ambulation/Gait Ambulation/Gait: Yes Ambulation/Gait Assistance: 3: Mod assist;2: Max assist Ambulation/Gait Assistance Details (indicate cue type and reason): Mod-Max assist to prevent lateral lean to the left. Tactile cues with therapists shoulder and hips to bring pt upright. Pt only able to maintain upright posture for ~3 seconds before left lean requiring cueing.  Ambulation Distance (Feet): 20  Feet Assistive device: Rolling walker Gait Pattern: Shuffle;Trunk flexed;Decreased hip/knee flexion - right;Decreased hip/knee flexion - left Gait velocity: decreased gait speed Stairs: No    Exercise    End of Session PT - End of Session Equipment Utilized During Treatment: Gait belt Activity Tolerance: Patient limited by fatigue Patient left: in chair;with call bell in reach Nurse Communication: Mobility status for ambulation;Mobility status for transfers General Behavior During Session: [facility] for tasks performed Cognition: Impaired Cognitive Impairment: pt did not recall PT from the day before and had difficulty following instructions today  Milana Kidney 05/23/2011, 2:05 PM  05/23/2011 Milana Kidney DPT PAGER: [phone] OFFICE: [phone]

## 2011-05-23 NOTE — Progress Notes (Signed)
Patient ID: Linda Davila, female   DOB: 11-17-1930, 78 y.o.   MRN: [id number] Subjective:  The patient is alert and pleasant. She looks well has no complaints.  Objective: Vital signs in last 24 hours: Temp:  [97.1 F (36.2 C)-98.6 F (37 C)] 98.2 F (36.8 C) (04/14 0400) Pulse Rate:  [83-102] 83  (04/14 0600) Resp:  [11-20] 13  (04/14 0600) BP: (115-165)/(54-93) 126/70 mmHg (04/14 0600) SpO2:  [96 %-100 %] 98 % (04/14 0600)  Intake/Output from previous day: 04/13 0701 - 04/14 0700 In: 2611.3 [P.O.:840; I.V.:1561.3; IV Piggyback:210] Out: 1725 [Urine:1725] Intake/Output this shift: Total I/O In: -  Out: 275 [Urine:275]  Physical exam the patient is alert and oriented. She has minimal weakness on the left her facial droop has largely resolved.  Lab Results: No results found for this basename: WBC:2,HGB:2,HCT:2,PLT:2 in the last 72 hours BMET No results found for this basename: NA:2,K:2,CL:2,CO2:2,GLUCOSE:2,BUN:2,CREATININE:2,CALCIUM:2 in the last 72 hours  Studies/Results: Ct Head Wo Contrast  05/21/2011  *RADIOLOGY REPORT*  Clinical Data: Shunt placement today.  Left facial droop.  CT HEAD WITHOUT CONTRAST  Technique:  Contiguous axial images were obtained from the base of the skull through the vertex without contrast.  Comparison: None.  Findings: Right frontal ventricular shunt catheter has been placed. The catheter goes through the internal capsule and caudate nucleus and anterior thalamus.  Catheter and appears to go just underneath the lateral ventricle and into the third ventricle and then passes through the third ventricle into the cistern lateral to the pineal gland.  There is a high density hemorrhage in the right putamen, right internal capsule and right thalamus which is posterior to the shunt catheter and may be a needle tract.  Mild to moderate ventricular enlargement.  No baseline studies are available for comparison.  Third and  lateral ventricles are dilated.  No  ventricular hemorrhage is identified.  There is generalized atrophy.  Chronic microvascular ischemia in the white matter. Mild pneumocephalus with subdural gas anteriorly on the right.  Shunt tubing extends into the right posterior neck.  IMPRESSION: Shunt catheter appears to go through the right anterior limb internal capsule and right anterior thalamus and passes below the lateral ventricle into the third ventricle with the tip located to the left of the pineal gland.  Acute hemorrhage posterior to the shunt tubing in the right basal ganglia and thalamus, most likely a needle tract.  Mild to moderate ventricular dilatation.  Original Report Authenticated By: Camelia Phenes, M.D.    Assessment/Plan: Postop day #2: The patient is doing well and I will transfer her to the general floor.  LOS: 2 days     Masami Plata D 05/23/2011, 8:21 AM

## 2011-05-24 ENCOUNTER — Encounter (HOSPITAL_COMMUNITY): Payer: Self-pay | Admitting: Physical Medicine and Rehabilitation

## 2011-05-24 DIAGNOSIS — R269 Unspecified abnormalities of gait and mobility: Secondary | ICD-10-CM

## 2011-05-24 DIAGNOSIS — G910 Communicating hydrocephalus: Secondary | ICD-10-CM

## 2011-05-24 MED ORDER — LEVETIRACETAM 500 MG PO TABS
500.0000 mg | ORAL_TABLET | Freq: Two times a day (BID) | ORAL | Status: DC
  Administered 2011-05-24 – 2011-05-27 (×7): 500 mg via ORAL
  Filled 2011-05-24 (×9): qty 1

## 2011-05-24 MED ORDER — BIMATOPROST 0.01 % OP SOLN
1.0000 [drp] | Freq: Every day | OPHTHALMIC | Status: DC
  Administered 2011-05-24 – 2011-05-27 (×4): 1 [drp] via OPHTHALMIC
  Filled 2011-05-24: qty 2.5

## 2011-05-24 MED ORDER — POLYETHYLENE GLYCOL 3350 17 G PO PACK
17.0000 g | PACK | Freq: Every day | ORAL | Status: DC
  Administered 2011-05-24 – 2011-05-25 (×2): 17 g via ORAL
  Filled 2011-05-24 (×5): qty 1

## 2011-05-24 NOTE — Consult Note (Signed)
Physical Medicine and Rehabilitation Consult Reason for Consult:NPH with gait disorder Referring Phsyician:  Dr. Venetia Maxon.     HPI: Linda Davila is an 78 y.o. female with history of HTN, progressive gait disorder worse since 6/12 but incontinece and memory issues two weeks PTA 04/12 due to NPH.  Pt elected to undergo lap assisted VP shunt placement 04/12 by Drs. Rosenbower and Dr. Venetia Maxon.  PT evaluation done 04/13and patient noted to have constant left lean with max cues to correct posture andficulty following directions. MD, PT recommending CIR.  Review of Systems  HENT: Negative for hearing loss.   Eyes: Negative for double vision.  Respiratory: Negative for shortness of breath.   Cardiovascular: Negative for chest pain.  Gastrointestinal: Positive for constipation. Negative for heartburn and nausea.  Genitourinary:       Incontinence  Musculoskeletal: Positive for falls (past couple of months). Negative for myalgias and back pain.  Neurological: Negative for headaches.  Psychiatric/Behavioral: The patient is not nervous/anxious and does not have insomnia.   All other systems reviewed and are negative.   Past Medical History  Diagnosis Date  . Hypertension   . Hypercholesteremia   . Thyroid disease   . Vertigo   . Cancer     skin/ on back  . Heart murmur   . PONV (postoperative nausea and vomiting)     N/V after Breast surgery  . Hypothyroidism     takes Synthroid   Past Surgical History  Procedure Date  . Cholecystectomy   . Appendectomy   . Mastectomy   . Abdominal hysterectomy   . Hemorroidectomy   . Bilateral veins stripping 1968   . Eye surgery     cataracts removed both eyes   Family History  Problem Relation Age of Onset  . Cancer Daughter     breast  . Anesthesia problems Neg Hx   . Hypotension Neg Hx   . Malignant hyperthermia Neg Hx   . Pseudochol deficiency Neg Hx   . Stroke Father   . Heart disease Mother    Social History: Lives alone. Did not work  out of home. Reports has been living with daughter for past 1+month due to falls?  Has been using a cane out of home setting.   She  never smoked. She does not have any smokeless tobacco history on file. She reports that she does drink alcohol once a month.  Does not use illicit drugs.   No Known Allergies  Prior to Admission medications   Medication Sig Start Date End Date Taking? Authorizing Provider  bimatoprost (LUMIGAN) 0.03 % ophthalmic solution Place 1 drop into both eyes at bedtime.   Yes Historical Provider, MD  levothyroxine (SYNTHROID, LEVOTHROID) 75 MCG tablet Take 75 mcg by mouth daily.   Yes Historical Provider, MD  lisinopril (PRINIVIL,ZESTRIL) 20 MG tablet Take 20 mg by mouth daily.   Yes Historical Provider, MD  loratadine (CLARITIN) 10 MG tablet Take 10 mg by mouth daily as needed. For allergies   Yes Historical Provider, MD  lovastatin (MEVACOR) 20 MG tablet Take 20 mg by mouth at bedtime.   Yes Historical Provider, MD  timolol (BETIMOL) 0.25 % ophthalmic solution Place 1 drop into the right eye 2 (two) times daily.    Yes Historical Provider, MD  VITAMIN D, ERGOCALCIFEROL, PO Take 1,000 Units by mouth daily.   Yes Historical Provider, MD   Scheduled Medications:    . bimatoprost  1 drop Both Eyes QHS  . docusate  sodium  100 mg Oral BID  . levetiracetam  500 mg Intravenous Q12H  . levothyroxine  75 mcg Oral Daily  . lisinopril  20 mg Oral Daily  . pantoprazole  40 mg Oral QHS  . polyethylene glycol  17 g Oral Daily  . simvastatin  5 mg Oral q1800  . timolol  1 drop Right Eye BID   PRN MED's: acetaminophen, acetaminophen, HYDROcodone-acetaminophen, labetalol, loratadine, morphine, ondansetron (ZOFRAN) IV, ondansetron, promethazine Home: Home Living Lives With: Alone Available Help at Discharge: Available PRN/intermittently;Family (daughter) Type of Home: House Home Access: Stairs to enter Secretary/administrator of Steps: 3 Entrance Stairs-Rails: Right Home  Layout: One level Bathroom Shower/Tub: Forensic scientist: Standard Bathroom Accessibility: Yes How Accessible: Accessible via walker Home Adaptive Equipment: Straight cane  Functional History: Prior Function Able to Take Stairs?: Yes Driving: No (not recently) Vocation: Retired Functional Status:  Mobility: Bed Mobility Bed Mobility: No Supine to Sit: 4: Min assist Supine to Sit Details (indicate cue type and reason): Min assist with trunk control secondary to L weakness and L lean Sitting - Scoot to Edge of Bed: 6: Modified independent (Device/Increase time) Transfers Transfers: Yes Sit to Stand: 4: Min assist;With armrests;With upper extremity assist;From bed;From chair/3-in-1 Sit to Stand Details (indicate cue type and reason): Min assist for stability secondary to balance loss and left lean Stand to Sit: To chair/3-in-1;4: Min assist;With armrests;With upper extremity assist Stand to Sit Details: Mina ssist for controlled descent. VC for hand placement for safety Stand Pivot Transfers: 3: Mod assist Stand Pivot Transfer Details (indicate cue type and reason): Transfer from straight chair to recliner. Mod assist for stability secondary to L lean. VC for sequencing with RW Ambulation/Gait Ambulation/Gait: Yes Ambulation/Gait Assistance: 3: Mod assist;2: Max assist Ambulation/Gait Assistance Details (indicate cue type and reason): Mod-Max assist to prevent lateral lean to the left. Tactile cues with therapists shoulder and hips to bring pt upright. Pt only able to maintain upright posture for ~3 seconds before left lean requiring cueing.  Ambulation Distance (Feet): 20 Feet Assistive device: Rolling walker Gait Pattern: Shuffle;Trunk flexed;Decreased hip/knee flexion - right;Decreased hip/knee flexion - left Gait velocity: decreased gait speed Stairs: No    ADL:    Cognition: Cognition Arousal/Alertness: Awake/alert Orientation Level: Oriented to  person;Oriented to time;Oriented to situation;Oriented to place Cognition Arousal/Alertness: Awake/alert Overall Cognitive Status: Appears within functional limits for tasks assessed Orientation Level: Oriented to person;Oriented to time;Oriented to situation;Oriented to place  Blood pressure 128/76, pulse 80, temperature 97.5 F (36.4 C), temperature source Oral, resp. rate 18, height 5\' 4"  (1.626 m), weight 63.7 kg (140 lb 6.9 oz), SpO2 95.00%. Physical Exam  Nursing note and vitals reviewed. Constitutional: She is oriented to person, place, and time. She appears well-developed and well-nourished.       Right scalp with dressing.  Leaning to the left with head flexed to the left with inability to correct to midline.   HENT:  Head: Normocephalic.  Eyes:       Unable to look up in superior fields with visual deficits.  Cardiovascular: Normal rate and regular rhythm.   Pulmonary/Chest: Effort normal and breath sounds normal.  Abdominal: Soft. Bowel sounds are normal.  Musculoskeletal: Normal range of motion.  Neurological: She is alert and oriented to person, place, and time. A cranial nerve deficit is present.       Follows basic one and two step commands without difficulty.  Speech clear.  Mild left facial weakness.  Slow movements.  Poor awareness and decreased insight into deficits. Limited vision. She had minimal vertical gaze. She tracked better to the right and left. Left visual field seemed to be quite affected. She tracked better to midline. She is able to identify fingers on the hand with 50% c on the right and 0% on the left.  She had upper extremity weakness at 4 is limited tear. Lower extremities were more equal 4-4+ bilaterally. No gross sensory deficits were seen  Skin: Skin is warm and dry.    No results found for this or any previous visit (from the past 24 hour(s)). No results found.  Assessment/Plan: Diagnosis: Gait disorder, cognitive changes, gait abnormalities  related to normal pressure hydrocephalus status post VP shunt 1. Does the need for close, 24 hr/day medical supervision in concert with the patient's rehab needs make it unreasonable for this patient to be served in a less intensive setting? Potentially 2. Co-Morbidities requiring supervision/potential complications: Hypertension, glaucoma 3. Due to bladder management, bowel management, safety, skin/wound care, disease management, medication administration and patient education, does the patient require 24 hr/day rehab nursing? Yes 4. Does the patient require coordinated care of a physician, rehab nurse, PT (1-2 hrs/day, 5 days/week), OT (1-2 hrs/day, 5 days/week) and SLP (1-2 hrs/day, 5 days/week) to address physical and functional deficits in the context of the above medical diagnosis(es)? Yes and Potentially Addressing deficits in the following areas: balance, endurance, locomotion, strength, transferring, bowel/bladder control, bathing, dressing, feeding, grooming, toileting, cognition, language and psychosocial support 5. Can the patient actively participate in an intensive therapy program of at least 3 hrs of therapy per day at least 5 days per week? Yes 6. The potential for patient to make measurable gains while on inpatient rehab is excellent 7. Anticipated functional outcomes upon discharge from inpatients are supervision to minimal assistance PT, supervision to minimal assistance OT, and SLP 8. Estimated rehab length of stay to reach the above functional goals is: 2 weeks 9. Does the patient have adequate social supports to accommodate these discharge functional goals? Potentially 10. Anticipated D/C setting: Home 11. Anticipated post D/C treatments: HH therapy 12. Overall Rehab/Functional Prognosis: excellent  RECOMMENDATIONS: This patient's condition is appropriate for continued rehabilitative care in the following setting: CIR Patient has agreed to participate in recommended program.  Yes and Potentially Note that insurance prior authorization may be required for reimbursement for recommended care.  Comment: Family needs to be aware of patient's potential care needs at discharge. She had been at [facility] place previously do to inability of family to provide for needed care. Rehabilitation nurse to followup.   Ranelle Oyster M.D. 05/24/2011

## 2011-05-24 NOTE — Progress Notes (Signed)
I spoke with daughter , Misty Stanley, by phone. She can not provide 24/7 assist at d/c. Therefore pt will need to return to SNF. Pt is aware. I have alerted SW. Please call with any questions. Pager [phone]

## 2011-05-24 NOTE — Progress Notes (Signed)
PHARMACIST - PHYSICIAN ORDER COMMUNICATION CONCERNING:  Availability of a Non-Formulary Medication  RECOMMENDATION:  Please feel free to call the Pharmacy at [phone] for assistance  DESCRIPTION:  This patient has an order for  Lumigan 0.03% Eye drops, which is NO LONGER MANUFACTURED.  To prevent interruption of therapy for a routine medication:  The medication has been substituted with the Lumigan 0.01% Eye drops which is the only manufactured product available.   Link Snuffer, PharmD, BCPS Clinical Pharmacist [phone] 05/24/2011

## 2011-05-24 NOTE — Progress Notes (Signed)
Clinical Social Work Department BRIEF PSYCHOSOCIAL ASSESSMENT 05/24/2011  Patient:  Linda Davila, Linda Davila     Account Number:  [id number]     Admit date:  05/21/2011  Clinical Social Worker:  Peggyann Shoals  Date/Time:  05/24/2011 04:00 PM  Referred by:  Physician  Date Referred:  05/24/2011 Referred for  SNF Placement   Other Referral:   Interview type:  Patient Other interview type:   CSW also spoke with pt's daughter on phone    PSYCHOSOCIAL DATA Living Status:  WITH ADULT CHILDREN Admitted from facility:   Level of care:   Primary support name:  Buena Irish Primary support relationship to patient:  CHILD, ADULT Degree of support available:   very supportive. Home:  [phone], Cell:  [phone].    CURRENT CONCERNS Current Concerns  Post-Acute Placement   Other Concerns:    SOCIAL WORK ASSESSMENT / PLAN CSW met with pt to address consult. CSW spoke with pt's daughter on phone regarding pt's dispo. Pt is not a CIR canidate and will SNF.   Assessment/plan status:  Other - See comment Other assessment/ plan:   CSW will initate SNF search and follow up bed offers.   Information/referral to community resources:   As needed    PATIENT'S/FAMILY'S RESPONSE TO PLAN OF CARE: Pt was pleasant, however she was very tired. CSW spoke with pt's daughter if       Dede Query, MSW, [place] [phone]

## 2011-05-24 NOTE — Evaluation (Signed)
Occupational Therapy Evaluation Patient Details Name: Linda Davila MRN: [id number] DOB: May 01, 1930 Today's Date: 05/24/2011  Problem List:  Patient Active Problem List  Diagnoses  . Weakness of both legs  . NPH (normal pressure hydrocephalus)  . Urinary incontinence without sensory awareness  . Gait abnormality  . Cognitive and behavioral changes  . Hypertension  . Hyperlipidemia  . Glaucoma    Past Medical History:  Past Medical History  Diagnosis Date  . Hypertension   . Hypercholesteremia   . Thyroid disease   . Vertigo   . Cancer     skin/ on back  . Heart murmur   . PONV (postoperative nausea and vomiting)     N/V after Breast surgery  . Hypothyroidism     takes Synthroid   Past Surgical History:  Past Surgical History  Procedure Date  . Cholecystectomy   . Appendectomy   . Mastectomy   . Abdominal hysterectomy   . Hemorroidectomy   . Bilateral veins stripping 1968   . Eye surgery     cataracts removed both eyes    OT Assessment/Plan/Recommendation OT Assessment Clinical Impression Statement:  78 y.o. female with history of HTN, progressive gait disorder worse since 6/12 but incontinece and memory issues two weeks PTA 04/12 due to NPH, pt had VP shunt placed  05/21/11.  Was in rehab at [facility] prior to this admission.  Pt requires min assist for most mobility and ADL.  Could benefit from intense rehab in order to return home with her daughter's assist. Pt with recent cognitive issues which need further evaluation.  Will follow acutely.  OT Recommendation/Assessment: Patient will need skilled OT in the acute care venue OT Problem List: Decreased knowledge of use of DME or AE;Impaired balance (sitting and/or standing) OT Therapy Diagnosis : Generalized weakness OT Plan OT Frequency: Min 2X/week OT Treatment/Interventions: Self-care/ADL training;DME and/or AE instruction;Therapeutic activities;Patient/family education OT Recommendation Recommendations  for Other Services: Rehab consult Follow Up Recommendations: Inpatient Rehab Equipment Recommended: Defer to next venue Individuals Consulted Consulted and Agree with Results and Recommendations: Patient OT Goals Acute Rehab OT Goals OT Goal Formulation: With patient Time For Goal Achievement: 2 weeks ADL Goals Pt Will Perform Grooming: with supervision;Standing at sink ADL Goal: Grooming - Progress: Goal set today Pt Will Perform Upper Body Bathing: with supervision;Sit to stand in shower ADL Goal: Upper Body Bathing - Progress: Goal set today Pt Will Perform Lower Body Bathing: Sit to stand in shower;with supervision ADL Goal: Lower Body Bathing - Progress: Goal set today Pt Will Perform Upper Body Dressing: with set-up;Sitting, bed ADL Goal: Upper Body Dressing - Progress: Goal set today Pt Will Perform Lower Body Dressing: Sitting, bed;Sit to stand from bed;with supervision ADL Goal: Lower Body Dressing - Progress: Goal set today Pt Will Transfer to Toilet: with supervision;Ambulation;3-in-1;Other (comment) (over toilet) ADL Goal: Toilet Transfer - Progress: Goal set today Pt Will Perform Toileting - Clothing Manipulation: with supervision;Standing ADL Goal: Toileting - Clothing Manipulation - Progress: Goal set today Pt Will Perform Tub/Shower Transfer: with supervision;Shower transfer;Ambulation;Shower seat with back ADL Goal: Web designer - Progress: Goal set today  OT Evaluation Precautions/Restrictions  Precautions Precautions: Fall Precaution Comments: VP shunt precautions Restrictions Weight Bearing Restrictions: No Prior Functioning Home Living Lives With: Alone Available Help at Discharge: Available PRN/intermittently;Family Type of Home: House Home Access: Stairs to enter [employer] of Steps: 3 Entrance Stairs-Rails: Right Home Layout: Two level Bathroom Shower/Tub: Walk-in shower;Tub/shower unit;Other (comment) (uses tub/shower ) Bathroom  Toilet:  Standard Home Adaptive Equipment: Straight cane Additional Comments: Pt and daughter do not feel pt is safe to return to daughter's home due to falls. Prior Function Level of Independence: Independent Able to Take Stairs?: Yes Driving: Yes Vocation: Retired  ADL ADL Eating/Feeding: Simulated;Independent Where Assessed - Eating/Feeding: Chair Grooming: Performed;Teeth care;Brushing hair;Wash/dry face;Minimal assistance Where Assessed - Grooming: Sitting, chair Upper Body Bathing: Simulated;Minimal assistance Where Assessed - Upper Body Bathing: Sitting, chair Lower Body Bathing: Simulated;Minimal assistance Where Assessed - Lower Body Bathing: Sitting, chair;Sit to stand from chair Upper Body Dressing: Simulated;Supervision/safety Where Assessed - Upper Body Dressing: Sitting, chair Lower Body Dressing: Performed;Minimal assistance;Other (comment) (set up for socks, min for pants) Where Assessed - Lower Body Dressing: Sitting, chair;Sit to stand from chair Toilet Transfer: Performed;Minimal assistance Toilet Transfer Method: Stand pivot Toilet Transfer Equipment: Set designer - Hygiene: Performed;Set up Where Assessed - Toileting Hygiene: Sit on 3-in-1 or toilet ADL Comments: Pt keeps head laterally flexed to L, but is aware and self corrects momentarily. Vision/Perception  Vision - History Baseline Vision: Wears glasses only for reading Visual History: Glaucoma Patient Visual Report: No change from baseline Cognition Cognition Arousal/Alertness: Awake/alert Overall Cognitive Status: Appears within functional limits for tasks assessed Orientation Level: Oriented X4 Sensation/Coordination Sensation Light Touch: Appears Intact Hot/Cold: Appears Intact Proprioception: Appears Intact Coordination Gross Motor Movements are Fluid and Coordinated: Yes Fine Motor Movements are Fluid and Coordinated: Yes Extremity Assessment RUE Assessment RUE Assessment:  Within Functional Limits (arthritic changes) LUE Assessment LUE Assessment: Within Functional Limits (arthritic changes-thumb MP) Mobility  Bed Mobility Bed Mobility: No Transfers Transfers: Yes Sit to Stand: 4: Min assist;From chair/3-in-1;With upper extremity assist Stand to Sit: To chair/3-in-1;4: Min assist;With armrests;With upper extremity assist End of Session OT - End of Session Equipment Utilized During Treatment: Gait belt Activity Tolerance: Patient tolerated treatment well Patient left: in chair;with call bell in reach General Behavior During Session: [facility] for tasks performed Cognition: [facility] for tasks performed   Evern Bio 05/24/2011, 10:12 AM [phone]

## 2011-05-24 NOTE — Progress Notes (Signed)
UR COMPLETED  

## 2011-05-24 NOTE — Progress Notes (Signed)
Physical Therapy Treatment Patient Details Name: Linda Davila MRN: [id number] DOB: 1930/10/20 Today's Date: 05/24/2011  PT Assessment/Plan  PT - Assessment/Plan Comments on Treatment Session: Patient slowly improving, although still has difficulty with all mobility due to left lateral lean requiring significant assistance to prevent falls. Continue per plan PT Plan: Frequency remains appropriate;Discharge plan needs to be updated PT Frequency: Min 4X/week Recommendations for Other Services: Rehab consult Follow Up Recommendations: Inpatient Rehab;Supervision/Assistance - 24 hour Equipment Recommended: Defer to next venue PT Goals  Acute Rehab PT Goals PT Goal Formulation: With patient PT Goal: Sit to Stand - Progress: Progressing toward goal PT Goal: Stand to Sit - Progress: Progressing toward goal PT Goal: Ambulate - Progress: Progressing toward goal  PT Treatment Precautions/Restrictions  Precautions Precautions: Fall Precaution Comments: VP shunt precautions Restrictions Weight Bearing Restrictions: No Mobility (including Balance) Bed Mobility Bed Mobility: Yes Supine to Sit: 5: Supervision Supine to Sit Details (indicate cue type and reason): Supervision for safety secondary to left lateral lean, pt able to complete bed mobility to left with more ease than right Sitting - Scoot to Edge of Bed: 6: Modified independent (Device/Increase time) Sit to Supine: 5: Supervision;With rail;HOB elevated (comment degrees) Sit to Supine - Details (indicate cue type and reason): VC for sequencing. Pt able to complete to the left side at an ease as compared to right. Transfers Transfers: Yes Sit to Stand: 4: Min assist;With upper extremity assist;From bed;From chair/3-in-1 Sit to Stand Details (indicate cue type and reason): Min assist for stability secondary to L lateral lean upon standing. VC for safety and upright positioning Stand to Sit: 5: Supervision;With upper extremity assist;To  bed;To chair/3-in-1 Stand to Sit Details: VC for hand placement and upruight positioning to stand Ambulation/Gait Ambulation/Gait: Yes Ambulation/Gait Assistance: 3: Mod assist Ambulation/Gait Assistance Details (indicate cue type and reason): Mod assist throughout ambulation secondary to L lateral lean. VC and tactile cues throughout treatment to correct stance, although pt unable to maintain Ambulation Distance (Feet): 20 Feet Assistive device: Rolling walker Gait Pattern: Shuffle;Trunk flexed;Decreased hip/knee flexion - right;Decreased hip/knee flexion - left;Lateral trunk lean to left Gait velocity: decreased gait speed Stairs: No  Static Standing Balance Static Standing - Balance Support: Bilateral upper extremity supported Static Standing - Level of Assistance: 3: Mod assist Static Standing - Comment/# of Minutes: Attempted static standing with pt in mirror to correct head turn and upright posture. Pt unable to maintain upright posture without left lateral lean for ~10 seconds before physical assist and cueing needed.  Exercise    End of Session PT - End of Session Equipment Utilized During Treatment: Gait belt Activity Tolerance: Patient limited by fatigue (pt complained of her "body is just tired") Patient left: in bed;with call bell in reach Nurse Communication: Mobility status for ambulation;Mobility status for transfers General Behavior During Session: [facility] for tasks performed Cognition: [employer] for tasks performed  Milana Kidney 05/24/2011, 4:25 PM  05/24/2011 Milana Kidney DPT PAGER: [phone] OFFICE: [phone]

## 2011-05-24 NOTE — Progress Notes (Signed)
3 Days Post-Op  Subjective: Eating breakfast. No abdominal pain.  Constipated.  Objective: Vital signs in last 24 hours: Temp:  [97.5 F (36.4 C)-98.4 F (36.9 C)] 97.5 F (36.4 C) (04/15 0600) Pulse Rate:  [78-90] 80  (04/15 0600) Resp:  [16-18] 18  (04/15 0600) BP: (124-138)/(73-85) 128/76 mmHg (04/15 0600) SpO2:  [95 %-98 %] 95 % (04/15 0600)    Intake/Output from previous day: 04/14 0701 - 04/15 0700 In: 615 [P.O.:360; I.V.:150; IV Piggyback:105] Out: 775 [Urine:775] Intake/Output this shift:    PE: Abd-soft, wounds clean and intact  Lab Results:  No results found for this basename: WBC:2,HGB:2,HCT:2,PLT:2 in the last 72 hours BMET No results found for this basename: NA:2,K:2,CL:2,CO2:2,GLUCOSE:2,BUN:2,CREATININE:2,CALCIUM:2 in the last 72 hours PT/INR No results found for this basename: LABPROT:2,INR:2 in the last 72 hours Comprehensive Metabolic Panel:    Component Value Date/Time   NA 136 05/17/2011 1133   K 4.3 05/17/2011 1133   CL 101 05/17/2011 1133   CO2 24 05/17/2011 1133   BUN 10 05/17/2011 1133   CREATININE 0.55 05/17/2011 1133   GLUCOSE 79 05/17/2011 1133   CALCIUM 9.3 05/17/2011 1133   AST 20 04/25/2011 0558   ALT 19 04/25/2011 0558   ALKPHOS 56 04/25/2011 0558   BILITOT 0.4 04/25/2011 0558   PROT 6.5 04/25/2011 0558   ALBUMIN 2.9* 04/25/2011 0558     Studies/Results: No results found.  Anti-infectives: Anti-infectives     Start     Dose/Rate Route Frequency Ordered Stop   05/21/11 1600   ceFAZolin (ANCEF) IVPB 1 g/50 mL premix        1 g 100 mL/hr over 30 Minutes Intravenous Every 8 hours 05/21/11 1110 05/22/11 0054   05/21/11 0849   bacitracin 40981 UNITS injection  Status:  Discontinued     Comments: RATCLIFF, ESTHER: cabinet override         05/21/11 0849 05/21/11 0942   05/21/11 0744   bacitracin 19147 UNITS injection     Comments: RATCLIFF, ESTHER: cabinet override         05/21/11 0744 05/21/11 1944   05/21/11 0715   bacitracin [id number] UNITS  injection     Comments: RATCLIFF, ESTHER: cabinet override         05/21/11 0715 05/21/11 1914   05/21/11 0000   ceFAZolin (ANCEF) IVPB 2 g/50 mL premix        2 g 100 mL/hr over 30 Minutes Intravenous 60 min pre-op 05/20/11 1423 05/21/11 0743          Assessment Active Problems: Post laparoscopic assisted vp shunt-abd wounds look good.  No BM in 4 days.    LOS: 3 days   Plan: Restart Miralax.  F/u with me prn.   Jenean Escandon J 05/24/2011

## 2011-05-24 NOTE — Progress Notes (Signed)
Subjective: Patient reports leaning to left  Objective: Vital signs in last 24 hours: Temp:  [97.5 F (36.4 C)-98.4 F (36.9 C)] 97.5 F (36.4 C) (04/15 0600) Pulse Rate:  [78-90] 80  (04/15 0600) Resp:  [16-18] 18  (04/15 0600) BP: (124-138)/(73-85) 128/76 mmHg (04/15 0600) SpO2:  [95 %-98 %] 95 % (04/15 0600)  Intake/Output from previous day: 04/14 0701 - 04/15 0700 In: 615 [P.O.:360; I.V.:150; IV Piggyback:105] Out: 775 [Urine:775] Intake/Output this shift:    Physical Exam: Patient leans to left while sitting up.  She has minimal facial droop and is able to walk with assistance.  Lab Results: No results found for this basename: WBC:2,HGB:2,HCT:2,PLT:2 in the last 72 hours BMET No results found for this basename: NA:2,K:2,CL:2,CO2:2,GLUCOSE:2,BUN:2,CREATININE:2,CALCIUM:2 in the last 72 hours  Studies/Results: No results found.  Assessment/Plan: Patient will need continued PT and help with mobility. Dressings CDI.    LOS: 3 days    Dorian Heckle, MD 05/24/2011, 7:09 AM

## 2011-05-25 ENCOUNTER — Encounter (HOSPITAL_COMMUNITY): Payer: Self-pay | Admitting: Neurosurgery

## 2011-05-25 NOTE — Progress Notes (Addendum)
Clinical Social Work Department CLINICAL SOCIAL WORK PLACEMENT NOTE 05/25/2011  Patient:  Linda Davila, Linda Davila  Account Number:  [id number] Admit date:  05/21/2011  Clinical Social Worker:  Peggyann Shoals  Date/time:  05/24/2011 04:00 PM  Clinical Social Work is seeking post-discharge placement for this patient at the following level of care:   SKILLED NURSING   (*CSW will update this form in Epic as items are completed)   05/24/2011  Patient/family provided with [facility] Department of Clinical Social Work's list of facilities offering this level of care within the geographic area requested by the patient (or if unable, by the patient's family).  05/24/2011  Patient/family informed of their freedom to choose among providers that offer the needed level of care, that participate in Medicare, Medicaid or managed care program needed by the patient, have an available bed and are willing to accept the patient.  05/24/2011  Patient/family informed of [facility]' ownership interest in [facility], as well as of the fact that they are under no obligation to receive care at this facility.  PASARR submitted to EDS on 05/24/2011 PASARR number received from EDS on   FL2 transmitted to all facilities in geographic area requested by pt/family on  05/25/2011 FL2 transmitted to all facilities within larger geographic area on   Patient informed that his/her managed care company has contracts with or will negotiate with  certain facilities, including the following:     Patient/family informed of bed offers received:  05/25/2011 Patient chooses bed at [facility] Physician recommends and patient chooses bed at  [facility]  Patient to be transferred to [facility] on  05/28/2011 Patient to be transferred to facility by [facility]  The following physician request were entered in Epic:   Additional Comments:  Dede Query, MSW, LCSWA [phone]

## 2011-05-25 NOTE — Progress Notes (Signed)
Patient needs therapy for leftward lean.  She will need help with gait and ambulation.

## 2011-05-25 NOTE — Progress Notes (Signed)
Physical Therapy Treatment Patient Details Name: Linda Davila MRN: [id number] DOB: Jul 02, 1930 Today's Date: 05/25/2011  PT Assessment/Plan  PT - Assessment/Plan Comments on Treatment Session: Pt was able to improve standing posture and duration with verbal and visual cues in mirror. Pt needs constant VC's and assistance to correct posture. Pt still requires +2 assistance for amb to ensure safety.  PT Plan: Discharge plan remains appropriate;Frequency remains appropriate PT Frequency: Min 4X/week Recommendations for Other Services: Rehab consult Follow Up Recommendations: Inpatient Rehab;Supervision/Assistance - 24 hour Equipment Recommended: Defer to next venue PT Goals  Acute Rehab PT Goals Pt will go Sit to Stand: with modified independence PT Goal: Sit to Stand - Progress: Progressing toward goal PT Goal: Stand to Sit - Progress: Progressing toward goal PT Goal: Ambulate - Progress: Progressing toward goal  PT Treatment Precautions/Restrictions  Precautions Precautions: Fall Precaution Comments: VP shunt precautions Restrictions Weight Bearing Restrictions: No Mobility (including Balance) Bed Mobility Bed Mobility: No Transfers Transfers: Yes Sit to Stand: With upper extremity assist;From chair/3-in-1;4: Min assist Sit to Stand Details (indicate cue type and reason): Pt required assist to stabilize trunk due to L lateral lean during ascent. Pt required VC's for hand placement on RW during ascent.  Stand to Sit: With upper extremity assist;With armrests;To chair/3-in-1;4: Min assist Stand to Sit Details: Pt required assistance to control descent and VC's to correct posture due to L lateral lean.  Ambulation/Gait Ambulation/Gait: Yes Ambulation/Gait Assistance: 1: +2 Total assist;3: Mod assist;Patient percentage (comment) (pt=50%) Ambulation/Gait Assistance Details (indicate cue type and reason): Pt needed assistance with RW placement and  VC's for gait pattern sequence. Pt  also required  assistance to swift body when pt tended to lean to L side follwed by tactile cues to initiate step on LE   Ambulation Distance (Feet): 7 Feet Assistive device: Rolling walker Gait Pattern: Decreased stride length;Step-to pattern;Decreased weight shift to right;Lateral trunk lean to right Stairs: No Wheelchair Mobility Wheelchair Mobility: No  Posture/Postural Control Posture/Postural Control: Postural limitations (Pt tends to lean towards L side) Balance Balance Assessed: Yes Static Sitting Balance Static Sitting - Comment/# of Minutes: Pt was able to correct sitting posture with VC's when pt leaned toward L side  Static Standing Balance Static Standing - Balance Support: Bilateral upper extremity supported;During functional activity Static Standing - Comment/# of Minutes: Pt was able to correct head and trunk posture in mirror with verbal and visual cues in mirror. Pt was able to stand upright for 5 mins with B UE support on sink   End of Session PT - End of Session Equipment Utilized During Treatment: Gait belt Activity Tolerance: Patient tolerated treatment well;Patient limited by fatigue Patient left: in chair;with call bell in reach Nurse Communication: Mobility status for transfers;Mobility status for ambulation General Behavior During Session: [facility] for tasks performed Cognition: [facility] for tasks performed  Linda Davila 05/25/2011, 3:13 PM

## 2011-05-25 NOTE — Progress Notes (Signed)
Subjective: Patient reports "I feel ok. My bowels have moved a little."  Objective: Vital signs in last 24 hours: Temp:  [97.6 F (36.4 C)-98.2 F (36.8 C)] 98.2 F (36.8 C) (04/16 0604) Pulse Rate:  [67-102] 75  (04/16 0604) Resp:  [18-20] 20  (04/16 0604) BP: (93-129)/(57-83) 121/76 mmHg (04/16 0604) SpO2:  [94 %-98 %] 97 % (04/16 0604)  Intake/Output from previous day:   Intake/Output this shift:    Alert, conversant, in good spirits. PEARL. Scalp drsgs intact, dry. Abd sites with Steri Strips, dry. BS actx4., abd soft, nontender.  Lab Results: No results found for this basename: WBC:2,HGB:2,HCT:2,PLT:2 in the last 72 hours BMET No results found for this basename: NA:2,K:2,CL:2,CO2:2,GLUCOSE:2,BUN:2,CREATININE:2,CALCIUM:2 in the last 72 hours  Studies/Results: No results found.  Assessment/Plan: Stable  LOS: 4 days  SNF search in progress. (CIR unavailable d/t lack of 24hr care post-d/c?)   Vielka Klinedinst, Arlys John 05/25/2011, 7:29 AM

## 2011-05-26 NOTE — Progress Notes (Signed)
05/26/2011 Rhylan Kagel Elizabeth PTA [phone] pager [phone] office    

## 2011-05-26 NOTE — Progress Notes (Signed)
CSW met with pt and daughter to address discharge plan. Per CIR Admissions Coordinator, CIR is not an option as pt does not have 24 hour supervision. Pt and family is agreeable to [facility] for continued to rehab. CSW contacted SNF regarding admission to [facility] when discharged. Anticipated discharge is tomorrow, 04/26/11. [name] is aware. CSW will continue to follow to facilitate discharge to SNF.   Dede Query, MSW, Theresia Majors [phone]

## 2011-05-26 NOTE — Progress Notes (Signed)
Physical Therapy Treatment Patient Details Name: [place] ARIANA JUUL MRN: [id number] DOB: 01-Jun-1930 Today's Date: 05/26/2011  PT Assessment/Plan  PT - Assessment/Plan Comments on Treatment Session: Pt was increase activity and standing duration. Pt was more awaress of postural limitation and was able to initiate correction but still needs VC's and assistance to ensure saftey. Pt improved amb distance; +2 assistance required to initiate movements and gait sequence PT Plan: Discharge plan remains appropriate;Frequency remains appropriate PT Frequency: Min 4X/week Recommendations for Other Services: Rehab consult Follow Up Recommendations: Inpatient Rehab;Supervision/Assistance - 24 hour Equipment Recommended: Defer to next venue PT Goals  Acute Rehab PT Goals PT Goal: Sit to Stand - Progress: Progressing toward goal PT Goal: Stand to Sit - Progress: Progressing toward goal PT Goal: Ambulate - Progress: Progressing toward goal  PT Treatment Precautions/Restrictions  Precautions Precautions: Fall Precaution Comments: Vp shunt precautions  Restrictions Weight Bearing Restrictions: No Mobility (including Balance) Bed Mobility Bed Mobility: No Transfers Transfers: Yes Sit to Stand: 3: Mod assist;With upper extremity assist;From chair/3-in-1 Sit to Stand Details (indicate cue type and reason): Pt required assitance to upright posture during ascent to stabilize trunk due to L lateral lean. Pt needed VC's for hand placement on RW during ascent.  Stand to Sit: With armrests;With upper extremity assist;To chair/3-in-1;4: Min assist (minguard A) Stand to Sit Details: Pt required Vc's and tactile cues to initiate hand placement on armrest during descent   Stand Pivot Transfers:  (minguard A ) Ambulation/Gait Ambulation/Gait: Yes Ambulation/Gait Assistance: Patient percentage (comment);1: +2 Total assist (pt=75%) Ambulation/Gait Assistance Details (indicate cue type and reason): pt required  assistance with RW placement and tactile cues to shift body weight to initiate step phase of gait pattern. VC's to maintain upright posture.  Ambulation Distance (Feet): 12 Feet Assistive device: Rolling walker Gait Pattern: Step-to pattern;Decreased stride length;Decreased weight shift to right;Lateral hip instability;Lateral trunk lean to right Stairs: No Wheelchair Mobility Wheelchair Mobility: No  Balance Balance Assessed: Yes Static Sitting Balance Static Sitting - Balance Support: Bilateral upper extremity supported;Feet supported Static Sitting - Level of Assistance: 3: Mod assist Static Sitting - Comment/# of Minutes: Pt increased sitting duration; VC's needed to correct sitting posture. Pt aware of L lateral lean and was able to initiate position change. Static Standing Balance Static Standing - Level of Assistance: 4: Min assist Static Standing - Comment/# of Minutes: Pt increase standing duration for pericare and treatment without rest period. Pt stood for ~10 mins with B UE support on RW  End of Session PT - End of Session Equipment Utilized During Treatment: Gait belt Activity Tolerance: Patient tolerated treatment well;Patient limited by fatigue Patient left: in chair;with call bell in reach Nurse Communication: Mobility status for transfers;Mobility status for ambulation General Behavior During Session: [facility] for tasks performed Cognition: [facility] for tasks performed  Tamera Stands 05/26/2011, 12:10 PM

## 2011-05-26 NOTE — Progress Notes (Signed)
Per Dr. Riley Kill request, I met with patient and her daughter, Misty Stanley at bedside. Daughter again confirms she is unable to provide 24/7 assist for patient at home even after 2 weeks of inpt rehab. Therefore I do recommend SNF rehab, for patient is not expected to progress to independent functional level over the next two weeks. Sister has also discussed with her brother and they are in agreement to SNF at this time. Please call for any questions. [phone]

## 2011-05-26 NOTE — Progress Notes (Signed)
Still leaning to left with sitting and ambulation, but better today than yesterday.  Continue aggressive PT as inpatient, since patient felt not to be candidate for inpatient Rehab.

## 2011-05-26 NOTE — Progress Notes (Signed)
05/26/2011 Akyra Bouchie Elizabeth PTA [phone] pager [phone] office    

## 2011-05-26 NOTE — Progress Notes (Signed)
Occupational Therapy Treatment Patient Details Name: Linda Davila MRN: [id number] DOB: [birth year] Today's Date: 05/26/2011  OT Assessment/Plan OT Assessment/Plan Comments on Treatment Session: Pt progressing toward goals.  Updating recommendation to SNF as CIR has determined pt is not a candidate.  Pt limited by fatigue today due to performing toileting tasks and standing with RN staff just prior to session. OT Plan: Discharge plan needs to be updated OT Frequency: Min 2X/week Follow Up Recommendations: Skilled nursing facility Equipment Recommended: Defer to next venue OT Goals Acute Rehab OT Goals OT Goal Formulation: With patient Time For Goal Achievement: 2 weeks ADL Goals Pt Will Perform Grooming: with supervision;Standing at sink ADL Goal: Grooming - Progress: Progressing toward goals Pt Will Transfer to Toilet: with supervision;Ambulation;3-in-1;Other (comment) ADL Goal: Toilet Transfer - Progress: Progressing toward goals Pt Will Perform Toileting - Clothing Manipulation: with supervision;Standing ADL Goal: Toileting - Clothing Manipulation - Progress: Progressing toward goals  OT Treatment Precautions/Restrictions  Precautions Precautions: Fall Precaution Comments: Vp shunt precautions  Restrictions Weight Bearing Restrictions: No   ADL ADL Grooming: Performed;Wash/dry face;Teeth care;Minimal assistance Grooming Details (indicate cue type and reason): Pt sat edge of chair for grooming tasks with intermittent min assist. to obtain midline orientation due to Lt. side lean. Pt reached anteriorly for grooming utensils with min assist to control posture. Where Assessed - Grooming: Sitting, chair Toilet Transfer: Performed;+2 Total assistance;Comment for patient % (75%) Toilet Transfer Details (indicate cue type and reason): +2 assist for maneuvering RW and to maintain midline orientation due to left side lean. Toilet Transfer Method: Surveyor, minerals:  Programme researcher, broadcasting/film/video Manipulation: Performed;Minimal assistance Toileting - Clothing Manipulation Details (indicate cue type and reason): Assist to pull gown over hips Where Assessed - Toileting Clothing Manipulation: Standing Toileting - Hygiene: Performed;Minimal assistance;+1 Total assistance Toileting - Hygiene Details (indicate cue type and reason): Min assist with front peri care while seated.  Total assist with back peri care while standing due to decreased balance and posture. Where Assessed - Toileting Hygiene: Sit on 3-in-1 or toilet;Standing Equipment Used: Rolling walker Ambulation Related to ADLs: Pt ambulated to sink with +2 pt 75% assist for maneuvering RW and maintaining balance.  Manual cues given throughout to bil. hips to facilitate weight shift and maintain upright posture. ADL Comments: Pt keeps head laterally flexed to L, but is aware and self corrects momentarily. Mobility  Bed Mobility Bed Mobility: No Transfers Sit to Stand: 3: Mod assist;With upper extremity assist;From chair/3-in-1 Sit to Stand Details (indicate cue type and reason): Pt required assitance to upright posture during ascent to stabilize trunk due to L lateral lean. Pt needed VC's for hand placement on RW during ascent.  Stand to Sit: With armrests;With upper extremity assist;To chair/3-in-1;4: Min assist (minguard A) Stand to Sit Details: Pt required Vc's and tactile cues to initiate hand placement on armrest during descent   Exercises    End of Session OT - End of Session Equipment Utilized During Treatment: Gait belt Activity Tolerance: Patient limited by fatigue Patient left: in chair;with call bell in reach Nurse Communication: Mobility status for transfers;Mobility status for ambulation General Behavior During Session: [facility] for tasks performed Cognition: [facility] for tasks performed   11:57 AM 05/26/2011 Cipriano Mile OTR/L Pager [phone] Office [phone]

## 2011-05-26 NOTE — Progress Notes (Signed)
Subjective: Patient reports "I feel alright."  Objective: Vital signs in last 24 hours: Temp:  [97.3 F (36.3 C)-98.2 F (36.8 C)] 98.2 F (36.8 C) (04/17 0619) Pulse Rate:  [78-94] 82  (04/17 0619) Resp:  [16-20] 20  (04/17 0619) BP: (106-132)/(67-82) 132/82 mmHg (04/17 0619) SpO2:  [95 %-98 %] 98 % (04/17 0619)  Intake/Output from previous day: 04/16 0701 - 04/17 0700 In: 180 [P.O.:180] Out: -  Intake/Output this shift:    Alert, conversant, without c/o pain or discomfort. PEARL. Good strength all extremities. Pt continues to lean to the left, able to straighten slowly with effort. Incisions without erythema, swelling, or drainage. Drsgs removed.  Lab Results: No results found for this basename: WBC:2,HGB:2,HCT:2,PLT:2 in the last 72 hours BMET No results found for this basename: NA:2,K:2,CL:2,CO2:2,GLUCOSE:2,BUN:2,CREATININE:2,CALCIUM:2 in the last 72 hours  Studies/Results: No results found.  Assessment/Plan: Stable  LOS: 5 days  Continue gait/strengthening with PT while SNF search continues. Incisions open to air. Patient's hair may be washed (incisions may be washed gently).   Georgiann Cocker 05/26/2011, 7:23 AM

## 2011-05-27 NOTE — Consult Note (Signed)
Reason for Consult:Left sided weakness  Referring Physician: Venetia Maxon  CC: Diplopia, left sided weakness  HPI: Linda Davila is an 78 y.o. female recently diagnosed with NPH.  Underwent shunt placement on 05/21/11 and experienced complications of left sided weakness.  Complains of diplopia as well.  Reports that she was having some visual issues prior to the surgery that was corrected with eyeglasses.  Feels that her current symptoms are different.  She currently has no complaints of diplopia but reports this is intermittent.  It also seems that her left sided weakness was worse initially and has improved somewhat with therapy.    Past Medical History  Diagnosis Date  . Hypertension   . Hypercholesteremia   . Thyroid disease   . Vertigo   . Cancer     skin/ on back  . Heart murmur   . PONV (postoperative nausea and vomiting)     N/V after Breast surgery  . Hypothyroidism     takes Synthroid    Past Surgical History  Procedure Date  . Cholecystectomy   . Appendectomy   . Mastectomy   . Abdominal hysterectomy   . Hemorroidectomy   . Bilateral veins stripping 1968   . Eye surgery     cataracts removed both eyes  . Ventriculoperitoneal shunt 05/21/2011    Procedure: SHUNT INSERTION VENTRICULAR-PERITONEAL;  Surgeon: Maeola Harman, MD;  Location: [facility];  Service: Neurosurgery;  Laterality: N/A;  laparoscopic Ventricular Peritoneal shunt placement with Dr Abbey Chatters    Family History  Problem Relation Age of Onset  . Cancer Daughter     breast  . Anesthesia problems Neg Hx   . Hypotension Neg Hx   . Malignant hyperthermia Neg Hx   . Pseudochol deficiency Neg Hx   . Stroke Father   . Heart disease Mother     Social History:  reports that she has never smoked. She does not have any smokeless tobacco history on file. She reports that she does not drink alcohol or use illicit drugs.  No Known Allergies  Medications:  I have reviewed the patient's current  medications. Scheduled:   . bimatoprost  1 drop Both Eyes QHS  . docusate sodium  100 mg Oral BID  . levETIRAcetam  500 mg Oral BID  . levothyroxine  75 mcg Oral Daily  . lisinopril  20 mg Oral Daily  . pantoprazole  40 mg Oral QHS  . polyethylene glycol  17 g Oral Daily  . simvastatin  5 mg Oral q1800  . timolol  1 drop Right Eye BID    ROS: History obtained from the patient  General ROS: negative for - chills, fatigue, fever, night sweats, weight gain or weight loss Psychological ROS: negative for - behavioral disorder, hallucinations, memory difficulties, mood swings or suicidal ideation Ophthalmic ROS: negative for - blurry vision, double vision, eye pain or loss of vision ENT ROS: negative for - epistaxis, nasal discharge, oral lesions, sore throat, tinnitus or vertigo Allergy and Immunology ROS: negative for - hives or itchy/watery eyes Hematological and Lymphatic ROS: negative for - bleeding problems, bruising or swollen lymph nodes Endocrine ROS: negative for - galactorrhea, hair pattern changes, polydipsia/polyuria or temperature intolerance Respiratory ROS: negative for - cough, hemoptysis, shortness of breath or wheezing Cardiovascular ROS: negative for - chest pain, dyspnea on exertion, edema or irregular heartbeat Gastrointestinal ROS: negative for - abdominal pain, diarrhea, hematemesis, nausea/vomiting or stool incontinence Genito-Urinary ROS: negative for - dysuria, hematuria, incontinence or urinary frequency/urgency Musculoskeletal  ROS: negative for - joint swelling or muscular weakness Neurological ROS: as noted in HPI, headache and back pain Dermatological ROS: negative for rash and skin lesion changes Physical Examination: Blood pressure 116/73, pulse 92, temperature 97.7 F (36.5 C), temperature source Oral, resp. rate 18, height 5\' 4"  (1.626 m), weight 63.7 kg (140 lb 6.9 oz), SpO2 94.00%.  Neurologic Examination Mental Status: Alert, oriented, thought  content appropriate.  Speech fluent without evidence of aphasia.  Able to follow 3 step commands without difficulty. Cranial Nerves: II: visual fields grossly normal, pupils equal, round, reactive to light and accommodation III,IV, VI: ptosis not present, extra-ocular motions intact horizontally with end point nystagmus bilaterally.  Restricted downward gaze. V,VII: left facial droop, facial light touch sensation normal bilaterally VIII: hearing normal bilaterally IX,X: gag reflex present XI: patient unable to turn head past midline to the left XII: tongue strength normal  Motor: Right : Upper extremity   5/5    Left:     Upper extremity   4+/5  Lower extremity   5/5     Lower extremity   4+/5 Tone and bulk:normal tone throughout; no atrophy noted Sensory: Pinprick and light touch intact throughout, bilaterally Deep Tendon Reflexes: 2+ in the upper extremities, trace at the knees and absent at the ankles Plantars: Right: downgoing   Left: downgoing Cerebellar: normal finger-to-nose and normal heel-to-shin test  No results found for this or any previous visit (from the past 48 hour(s)).  No results found for this or any previous visit (from the past 240 hour(s)).  No results found.   Assessment/Plan:  Patient Active Hospital Problem List: Left sided weakness   Assessment:  Films show evidence of internal capsular damage and small amount of hemorrhage in track of shunt.  Patient's exam is consistent with this.  Has had improvement with time and therapy but has not returned to baseline.  Clinical presentation and prognosis discussed at length with daughter.  At this point it was stressed that the most important thing for her mother is to have consistent therapy for optimal chance at maximal improvement.  Patient and mother express understanding.  All questions addressed.   Plan:  Agree with long term care rehab facility placement  Thana Farr, MD [facility] [phone] 05/27/2011, 3:54 PM

## 2011-05-27 NOTE — Progress Notes (Signed)
Utilization review completed. Lejon Afzal, RN, BSN. 05/27/11  

## 2011-05-27 NOTE — Progress Notes (Signed)
Patient ID: Linda Davila, female   DOB: Mar 02, 1930, 78 y.o.   MRN: [id number]  Awake, conversant. C/o headache persisting since early am. "entire head" per pt. PEARL. Denies diplopia today, testing from across the room.  Head to right , looking toward windows now, less leftward lean (while in bed so far today). Nursing reports poor appetite, supplements of Ensure offered frequently. Pt seems determined to improve & has a strong desire to ambulate; however is limited by weakness/ low endurance. Daughter not present with this visit. Dr. Venetia Maxon will visit to discuss plans, hopefully able to meet with daughter today.  Georgiann Cocker, RN, BSN

## 2011-05-27 NOTE — Progress Notes (Signed)
At 1430, pt was bladder scanned and had approx. 570 cc of urine in her bladder, patient was In and out cath, and voided 600 cc of clear, yellow urine, patient tolerated well, patient stable, will continue to monitor patient.

## 2011-05-27 NOTE — Progress Notes (Signed)
Pt unable to void for 7 hours. Has attempted to void x1, no results. Pt bladder scan showed 630 mL. PRN Order to in and out catheterize patient. Catheterization done using sterile technique. Pt tolerated well. 700 mL of clear, yellow colored urine drained from bladder via gravity. Will continue to monitor.

## 2011-05-27 NOTE — Progress Notes (Signed)
Subjective: Patient reports "I feel ok...a little headache, not as bad as it was yesterday"  Objective: Vital signs in last 24 hours: Temp:  [97.9 F (36.6 C)-98.5 F (36.9 C)] 98.4 F (36.9 C) (04/18 0604) Pulse Rate:  [92-106] 92  (04/18 0604) Resp:  [16-20] 16  (04/18 0604) BP: (96-120)/(61-73) 111/69 mmHg (04/18 0604) SpO2:  [92 %-97 %] 94 % (04/18 0604)  Intake/Output from previous day:   Intake/Output this shift:    Alert, conversant. Moves all extremities, good strength. Leans left. Some left facial droop persists.Pt states nurses walked her to bedside commode yesterday; doesn't remember PT's visit.   Lab Results: No results found for this basename: WBC:2,HGB:2,HCT:2,PLT:2 in the last 72 hours BMET No results found for this basename: NA:2,K:2,CL:2,CO2:2,GLUCOSE:2,BUN:2,CREATININE:2,CALCIUM:2 in the last 72 hours  Studies/Results: No results found.  Assessment/Plan: Stable  LOS: 6 days  Continue mobilization with PT.    Georgiann Cocker 05/27/2011, 7:33 AM     I have spoken to Dr. Sherryll Burger in [place] per pt's daughter request and also asked for a Neurology consult from Dr. Thad Ranger, per her request as well.  I have made every possible effort to have patient admitted to [facility] service, but after discussions with Dr. Hermelinda Medicus and the Rehab team, I was told that she is not a candidate for inpatient Rehab, since the family states that patient will not be able to come home with 24 hour/day supervision and will need to go to a SNF after discharge.  Dr. Hermelinda Medicus explained to me that, under these circumstances, patient cannot come to Rehab as an inpatient as this would be considered Medicare Fraud.  I relayed these concerns to patient's daughter.  I have been informed by the Hospital Case Manager that patient no longer "meets criteria" for continued inpatient stay, despite our request for a Neurology consult and efforts to continue inpatient Physical Therapy.  They have made  arrangements for patient to be transferred to [facility] for SNF/physical therapy/Rehab.

## 2011-05-27 NOTE — Progress Notes (Signed)
Patient ID: Linda Davila, female   DOB: 02-26-1930, 78 y.o.   MRN: [id number]  Awake, in bed reporting h/a no longer present. Some improvement in po intake, with Ensure supplements. Urinary retention - 610cc this afternoon via I&O cath; ~700 last night. Pt without report of discomfort. Voiding throughout day today. Per Dr. Venetia Maxon, continue I&O caths PRN q6hrs; do not place indwelling foley.  Will discuss further plans after discussion with Neurology & family.  Georgiann Cocker, RN, BSN

## 2011-05-27 NOTE — Progress Notes (Signed)
CSW met with pt's daughter to address concerns that she has regarding pt's care. [name] expressed that pt needs to have a neurological consult prior to discharge to SNF due to depth perception concerns, left field difficulty, and left sided weakness, and double vision. CSW addressed this concern with Georgiann Cocker, RN and he shared that a consult has been placed and the nuerologist will be seeing pt for neuro eval. CSW will continue to follow to facilitate discharge. [facility] is aware.   Dede Query, MSW, [facility] [phone]

## 2011-05-27 NOTE — Progress Notes (Signed)
Pt unable to void for 6 hours. Has attempted to void x2, no results. Pt bladder scan showed 599 mL. MD called and order received to in and out catheterize patient. Catheterization done using sterile technique. Pt tolerated well. 700 mL of clear, yellow colored urine drained from bladder via gravity. Will continue to monitor.

## 2011-05-28 NOTE — Progress Notes (Signed)
Pt is ready for discharge today to [facility]. Facility has received discharge summary and is ready to admit pt. Pt and family are agreeable to discharge plan. [facility] will provide transportation to facility. CSW is signing off as no further clinical social work needs identified.   Dede Query, MSW, [facility] Majors [phone]

## 2011-05-28 NOTE — Discharge Summary (Signed)
Physician Discharge Summary  Patient ID: Linda Davila MRN: [id number] DOB/AGE: 76-Jul-1932 76 y.o.  Admit date: 05/21/2011 Discharge date: 05/28/2011  Admission Diagnoses:Normal Pressure Hydrocephalus (Dementia, urinary incontinence, gait disorder)  Discharge Diagnoses: Same with left hemiparesis, urinary retention Active Problems:  * No active hospital problems. *    Discharged Condition: Improving  Hospital Course: Patient underwent laparoscopic placement of ventriculo-peritoneal shunt for NPH on day of admission.  Postoperative course was complicated by a left hemiparesis with facial droop.  Postoperative CT scan revealed posteriorly projecting ventricular catheter.  Patient has been working with PT, but has been slow to improve with gait and ambulation.  She had intermittent diplopia, which has resolved. She was evaluated for inpatient Rehab and felt not to be a candidate.  She was seen in consultation by Neurology.  It was felt that she would likely do best with Rehab at SNF and patient and her daughter elected for her to go to [facility], the SNF she came from prior to admission.  Consults: neurology and rehabilitation medicine  Significant Diagnostic Studies: radiology: CT scan:   Treatments: surgery: VP shunt  Discharge Exam: Blood pressure 146/79, pulse 96, temperature 97.8 F (36.6 C), temperature source Oral, resp. rate 22, height 5\' 4"  (1.626 m), weight 63.7 kg (140 lb 6.9 oz), SpO2 96.00%. Awake, alert, conversant.  Leans to left (improving) but good power all four extremities without significant drift.  Mild facial droop and left hemiparesis.  Incisions clean, dry, intact.  Disposition: SNF   Medication List  As of 05/28/2011  7:46 AM   TAKE these medications         bimatoprost 0.03 % ophthalmic solution   Commonly known as: LUMIGAN   Place 1 drop into both eyes at bedtime.      levothyroxine 75 MCG tablet   Commonly known as: SYNTHROID, LEVOTHROID   Take 75  mcg by mouth daily.      lisinopril 20 MG tablet   Commonly known as: PRINIVIL,ZESTRIL   Take 20 mg by mouth daily.      loratadine 10 MG tablet   Commonly known as: CLARITIN   Take 10 mg by mouth daily as needed. For allergies      lovastatin 20 MG tablet   Commonly known as: MEVACOR   Take 20 mg by mouth at bedtime.      timolol 0.25 % ophthalmic solution   Commonly known as: BETIMOL   Place 1 drop into the right eye 2 (two) times daily.      VITAMIN D (ERGOCALCIFEROL) PO   Take 1,000 Units by mouth daily.             Signed: Dorian Heckle, MD 05/28/2011, 7:46 AM

## 2011-05-28 NOTE — Discharge Instructions (Addendum)
Intermittent straight catheterization every 6 hours as needed for urinary retention.  Follow up as an outpatient with Urologist if urinary retention persists  Will arrange Neurophthalmology  Consultation as outpatient for intermittent double vision  Sutures out 14 days postop  Office visit with Dr. Venetia Maxon 3 - 4 weeks postoperatively

## 2011-05-28 NOTE — Progress Notes (Signed)
  Patient ID: Linda Davila, female   DOB: [birth year], 78 y.o.   MRN: [id number]  Awakens to voice, without complaint of pain or discomfort...specifically, no headache this am. No change from yesterday neurologically: mild left facial droop, weakness/endurance hinders activity, moving all extremities with good strength. Incisions without erythema, drainage, or swelling. Urinary retention persists, with I&O required ~2300 last evening. Plan for d/c to SNF today, continuing I&O caths PRN. Sutures at scalp incisions may be removed 2weeks post-op (DOS 05-21-11). Pt should f/u with Dr. Venetia Maxon in office 3-4weeks post-op.  Georgiann Cocker, RN, BSN   Per daughter's request, will arrange outpatient Neuro Ophthalmology consultation at [facility].

## 2011-08-17 DIAGNOSIS — E785 Hyperlipidemia, unspecified: Secondary | ICD-10-CM

## 2011-08-17 DIAGNOSIS — G912 (Idiopathic) normal pressure hydrocephalus: Secondary | ICD-10-CM

## 2011-08-17 DIAGNOSIS — E039 Hypothyroidism, unspecified: Secondary | ICD-10-CM

## 2011-08-17 DIAGNOSIS — I10 Essential (primary) hypertension: Secondary | ICD-10-CM

## 2011-09-24 DIAGNOSIS — E785 Hyperlipidemia, unspecified: Secondary | ICD-10-CM

## 2011-09-24 DIAGNOSIS — I10 Essential (primary) hypertension: Secondary | ICD-10-CM

## 2011-09-24 DIAGNOSIS — E039 Hypothyroidism, unspecified: Secondary | ICD-10-CM

## 2011-09-24 DIAGNOSIS — R269 Unspecified abnormalities of gait and mobility: Secondary | ICD-10-CM

## 2011-09-24 DIAGNOSIS — H409 Unspecified glaucoma: Secondary | ICD-10-CM

## 2011-10-22 DIAGNOSIS — E039 Hypothyroidism, unspecified: Secondary | ICD-10-CM

## 2011-10-22 DIAGNOSIS — E785 Hyperlipidemia, unspecified: Secondary | ICD-10-CM

## 2011-10-22 DIAGNOSIS — R269 Unspecified abnormalities of gait and mobility: Secondary | ICD-10-CM

## 2011-10-22 DIAGNOSIS — I10 Essential (primary) hypertension: Secondary | ICD-10-CM

## 2011-10-25 ENCOUNTER — Telehealth: Payer: Self-pay | Admitting: Internal Medicine

## 2011-10-25 NOTE — Telephone Encounter (Signed)
Caller: Emonnie/Patient; Patient Name: Linda Davila, Linda Davila; PCP: Tillman Abide [facility]); Best Callback Phone Number: [phone]  Today, 10/25/2011, pt calling with complaints of urinary urgency with frequency and dysuria  since 10/24/2011. She started pushing more fluids last night, but still  with symptoms. Pt is currently a resident at [facility] for rehab for brain shunt in April 2013.  Pt has told facilty nurse about her symptoms ,  and was told to push fluids, but the facilty  nurse doesn't know  that she is calling today. RN triaged symptoms  and reached See in 24 hours Disposition for UTI symptoms   per Urinary Symptoms - Female   protocol. Triage RN called [facility]([phone])  and spoke to  facilty RN  , Dondra Spry  Re RN, about pt's complain, and she stated she talked with Dr. Alphonsus Sias this morning about patient's symptoms,  and was told to monitor symptoms and   if symptoms still persist to call 10/26/2011.  Dondra Spry. RN also stated that Dr. Alphonsus Sias would be back in the facilty tomorrow.  Pt  was advised and to  also let the facilty nurse if symptoms worsened.

## 2011-10-25 NOTE — Telephone Encounter (Signed)
Urinalysis and culture ordered---to Bonita Quin the nurse now who has her

## 2011-10-26 DIAGNOSIS — N390 Urinary tract infection, site not specified: Secondary | ICD-10-CM

## 2011-11-09 ENCOUNTER — Telehealth: Payer: Self-pay | Admitting: Internal Medicine

## 2011-11-09 NOTE — Telephone Encounter (Signed)
A nurse called the triage line hoping to get orders for occupational therapy for the patient.  The callback is [phone].

## 2011-11-10 NOTE — Telephone Encounter (Signed)
Left message with "Amy" on the number listed below, with results.

## 2011-11-10 NOTE — Telephone Encounter (Signed)
Let them know she is no longer under my care since leaving [facility] rehab They need to ask her regular primary care doctor

## 2011-11-11 DIAGNOSIS — R269 Unspecified abnormalities of gait and mobility: Secondary | ICD-10-CM

## 2011-11-11 DIAGNOSIS — G912 (Idiopathic) normal pressure hydrocephalus: Secondary | ICD-10-CM

## 2011-11-11 DIAGNOSIS — R609 Edema, unspecified: Secondary | ICD-10-CM

## 2011-11-11 DIAGNOSIS — I10 Essential (primary) hypertension: Secondary | ICD-10-CM

## 2011-12-29 ENCOUNTER — Ambulatory Visit: Payer: Medicare Other | Admitting: Internal Medicine

## 2012-03-21 ENCOUNTER — Emergency Department: Payer: Self-pay | Admitting: Emergency Medicine

## 2012-09-29 IMAGING — CR DG CHEST 2V
2 series · 2 of 2 positions shown · non-contrast
Comparison: None.

CLINICAL DATA: Preop.

CHEST - 2 VIEW

[view not recorded (1 of 2)]
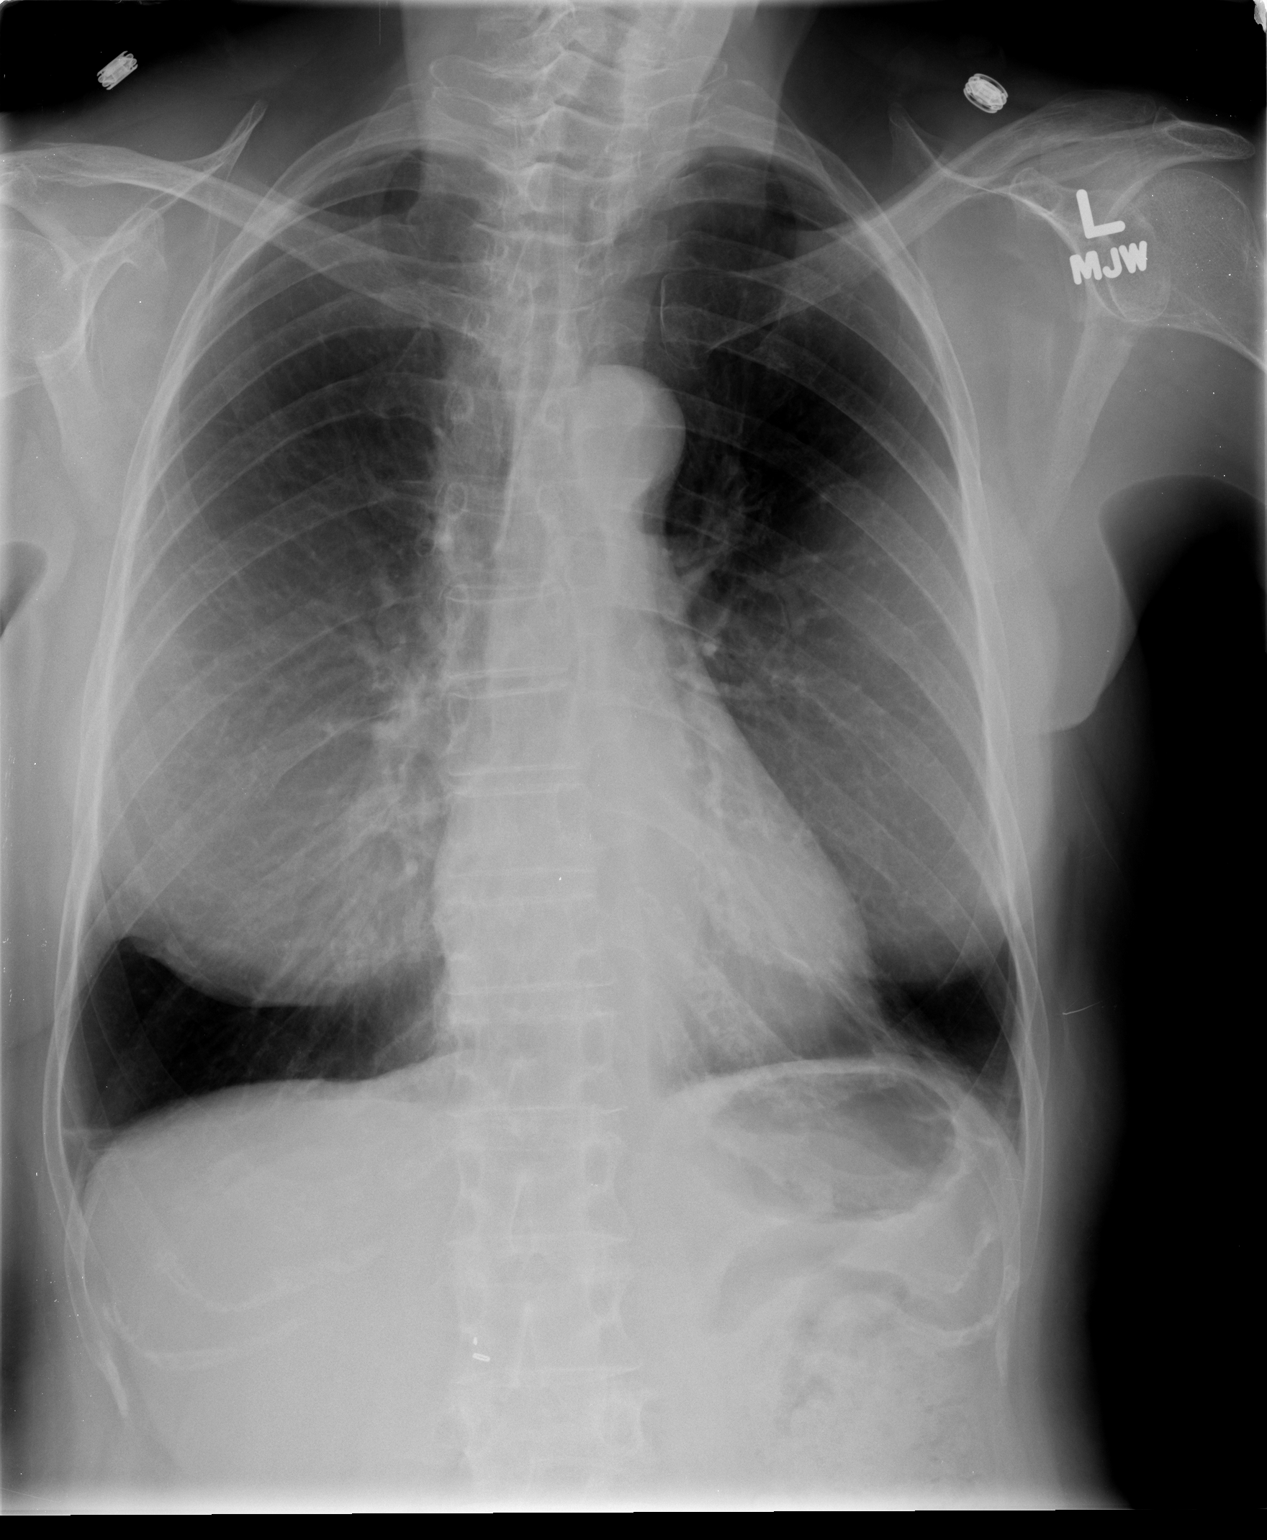

[view not recorded (2 of 2)]
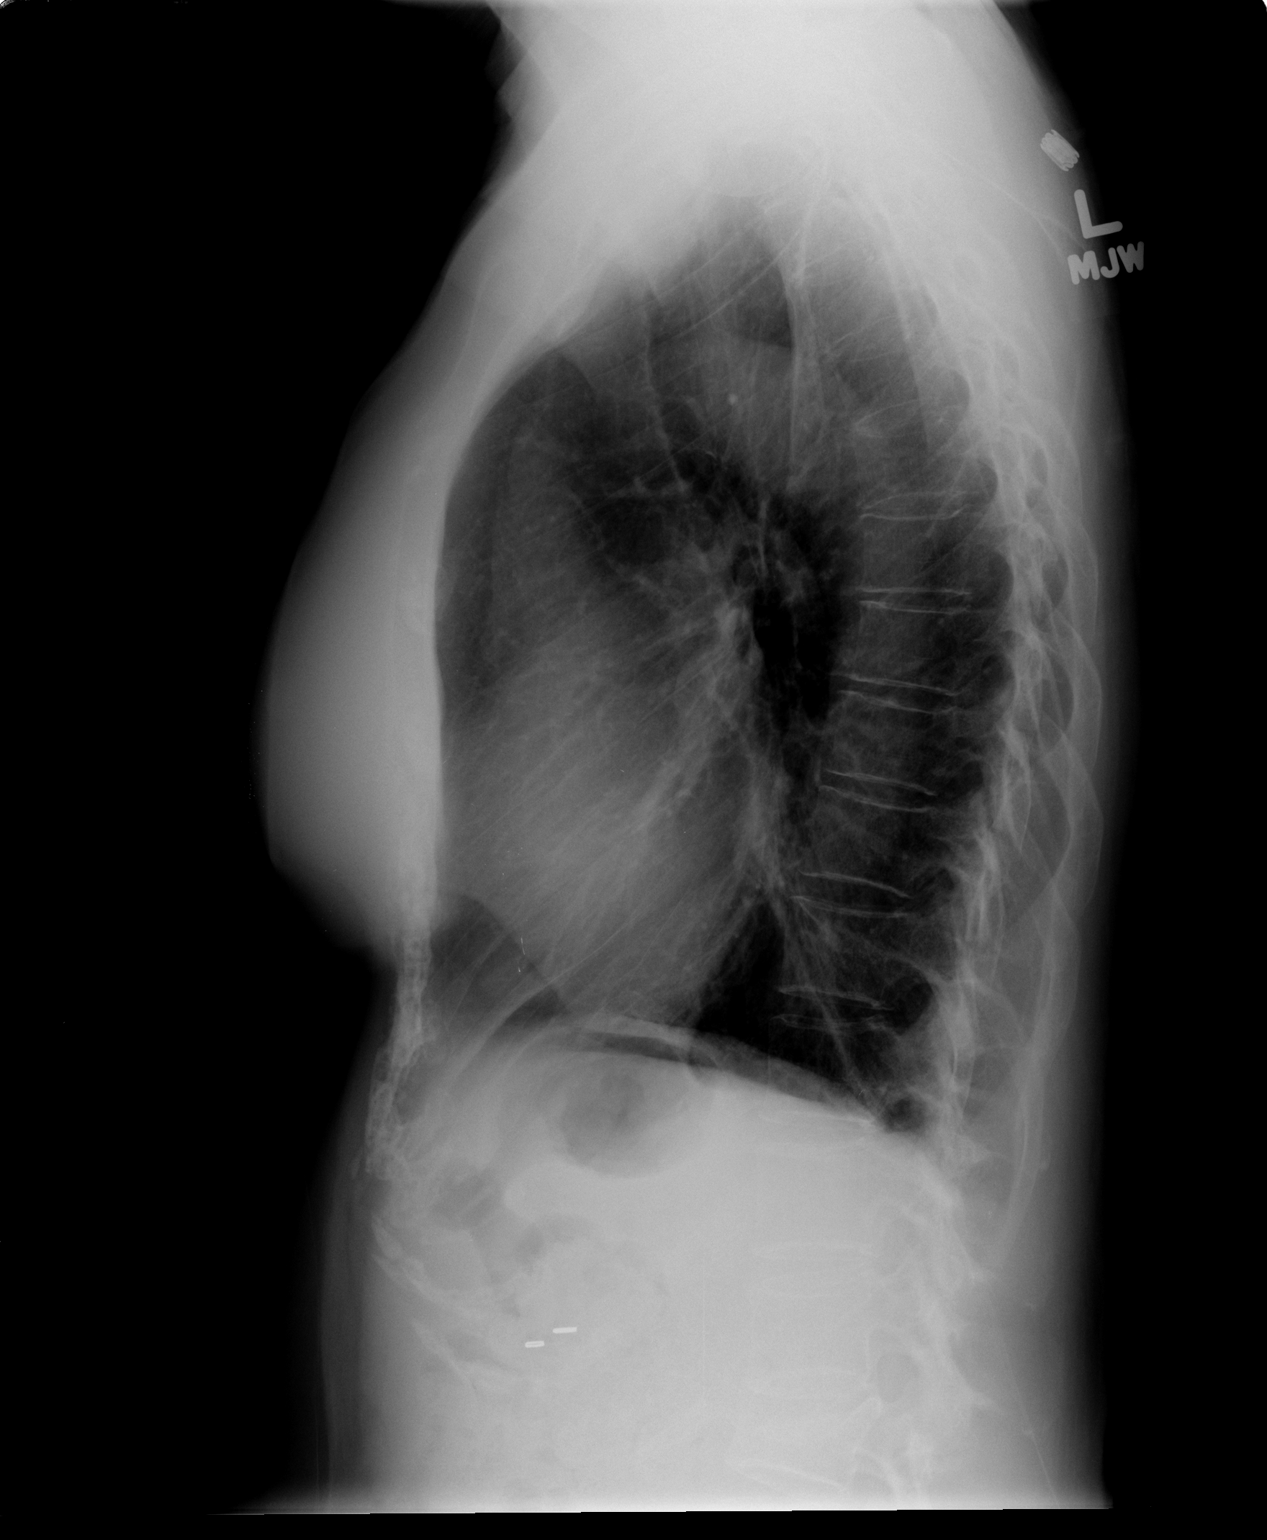

[2 of 2 positions shown; findings below may reference images not displayed]

FINDINGS: Trachea is midline.  Heart size normal.  Lungs are
hyperexpanded with scarring at the lung bases.  No pleural fluid.
IMPRESSION: No acute findings.

## 2013-02-06 ENCOUNTER — Emergency Department: Payer: Self-pay | Admitting: Emergency Medicine

## 2013-02-06 LAB — URINALYSIS, COMPLETE
Bilirubin,UR: NEGATIVE
Ketone: NEGATIVE
Protein: NEGATIVE
Squamous Epithelial: 1
WBC UR: 71 /HPF (ref 0–5)

## 2013-02-06 LAB — COMPREHENSIVE METABOLIC PANEL
Albumin: 2.8 g/dL — ABNORMAL LOW (ref 3.4–5.0)
Chloride: 102 mmol/L (ref 98–107)
Creatinine: 0.58 mg/dL — ABNORMAL LOW (ref 0.60–1.30)
Glucose: 101 mg/dL — ABNORMAL HIGH (ref 65–99)
Osmolality: 268 (ref 275–301)
Potassium: 3 mmol/L — ABNORMAL LOW (ref 3.5–5.1)
Sodium: 134 mmol/L — ABNORMAL LOW (ref 136–145)
Total Protein: 6.7 g/dL (ref 6.4–8.2)

## 2013-02-06 LAB — TROPONIN I: Troponin-I: <0.02 ng/mL

## 2013-02-06 LAB — CBC
HGB: 14.4 g/dL (ref 12.0–16.0)
MCHC: 34.4 g/dL (ref 32.0–36.0)
MCV: 90 fL (ref 80–100)
RBC: 4.66 10*6/uL (ref 3.80–5.20)
RDW: 12.7 % (ref 11.5–14.5)

## 2013-02-08 LAB — URINE CULTURE

## 2013-07-05 ENCOUNTER — Emergency Department: Payer: Self-pay | Admitting: Internal Medicine

## 2013-08-25 ENCOUNTER — Emergency Department: Payer: Self-pay | Admitting: Emergency Medicine

## 2013-08-25 LAB — URINALYSIS, COMPLETE
BILIRUBIN, UR: NEGATIVE
BLOOD: NEGATIVE
Bacteria: NONE SEEN
Glucose,UR: NEGATIVE mg/dL (ref 0–75)
KETONE: NEGATIVE
Leukocyte Esterase: NEGATIVE
NITRITE: NEGATIVE
PH: 7 (ref 4.5–8.0)
Protein: NEGATIVE
RBC,UR: 1 /HPF (ref 0–5)
Specific Gravity: 1.009 (ref 1.003–1.030)
WBC UR: 2 /HPF (ref 0–5)

## 2014-03-24 ENCOUNTER — Emergency Department: Payer: Self-pay | Admitting: Emergency Medicine

## 2014-03-26 ENCOUNTER — Emergency Department: Payer: Self-pay | Admitting: Student

## 2014-04-03 ENCOUNTER — Emergency Department: Payer: Self-pay | Admitting: Student

## 2014-04-13 ENCOUNTER — Observation Stay: Payer: Self-pay | Admitting: Internal Medicine

## 2014-04-14 ENCOUNTER — Ambulatory Visit: Payer: Self-pay | Admitting: Neurology

## 2014-04-27 ENCOUNTER — Emergency Department (HOSPITAL_COMMUNITY): Payer: Medicare Other

## 2014-04-27 ENCOUNTER — Emergency Department (HOSPITAL_COMMUNITY)
Admission: EM | Admit: 2014-04-27 | Discharge: 2014-04-27 | Disposition: A | Discharge status: Home / Self Care | Payer: Medicare Other | Attending: Emergency Medicine | Admitting: Emergency Medicine

## 2014-04-27 ENCOUNTER — Encounter (HOSPITAL_COMMUNITY): Payer: Self-pay | Admitting: *Deleted

## 2014-04-27 DIAGNOSIS — Y288XXA Contact with other sharp object, undetermined intent, initial encounter: Secondary | ICD-10-CM | POA: Diagnosis not present

## 2014-04-27 DIAGNOSIS — S51812A Laceration without foreign body of left forearm, initial encounter: Secondary | ICD-10-CM | POA: Insufficient documentation

## 2014-04-27 DIAGNOSIS — E780 Pure hypercholesterolemia: Secondary | ICD-10-CM | POA: Insufficient documentation

## 2014-04-27 DIAGNOSIS — Y9389 Activity, other specified: Secondary | ICD-10-CM | POA: Diagnosis not present

## 2014-04-27 DIAGNOSIS — Y92092 Bedroom in other non-institutional residence as the place of occurrence of the external cause: Secondary | ICD-10-CM | POA: Diagnosis not present

## 2014-04-27 DIAGNOSIS — Z85828 Personal history of other malignant neoplasm of skin: Secondary | ICD-10-CM | POA: Diagnosis not present

## 2014-04-27 DIAGNOSIS — I10 Essential (primary) hypertension: Secondary | ICD-10-CM | POA: Diagnosis not present

## 2014-04-27 DIAGNOSIS — IMO0002 Reserved for concepts with insufficient information to code with codable children: Secondary | ICD-10-CM

## 2014-04-27 DIAGNOSIS — R011 Cardiac murmur, unspecified: Secondary | ICD-10-CM | POA: Diagnosis not present

## 2014-04-27 DIAGNOSIS — Z7982 Long term (current) use of aspirin: Secondary | ICD-10-CM | POA: Diagnosis not present

## 2014-04-27 DIAGNOSIS — E039 Hypothyroidism, unspecified: Secondary | ICD-10-CM | POA: Diagnosis not present

## 2014-04-27 DIAGNOSIS — Z79899 Other long term (current) drug therapy: Secondary | ICD-10-CM | POA: Diagnosis not present

## 2014-04-27 DIAGNOSIS — W01198A Fall on same level from slipping, tripping and stumbling with subsequent striking against other object, initial encounter: Secondary | ICD-10-CM | POA: Insufficient documentation

## 2014-04-27 DIAGNOSIS — W19XXXA Unspecified fall, initial encounter: Secondary | ICD-10-CM

## 2014-04-27 DIAGNOSIS — Z23 Encounter for immunization: Secondary | ICD-10-CM | POA: Diagnosis not present

## 2014-04-27 DIAGNOSIS — Y998 Other external cause status: Secondary | ICD-10-CM | POA: Diagnosis not present

## 2014-04-27 LAB — HEPATITIS B SURFACE ANTIGEN: Hepatitis B Surface Ag: NEGATIVE

## 2014-04-27 LAB — RAPID HIV SCREEN (HIV 1/2 AB+AG)
HIV 1/2 Antibodies: NONREACTIVE
HIV-1 P24 Antigen - HIV24: NONREACTIVE

## 2014-04-27 MED ORDER — TETANUS-DIPHTH-ACELL PERTUSSIS 5-2.5-18.5 LF-MCG/0.5 IM SUSP
0.5000 mL | Freq: Once | INTRAMUSCULAR | Status: AC
  Administered 2014-04-27: 0.5 mL via INTRAMUSCULAR
  Filled 2014-04-27: qty 0.5

## 2014-04-27 MED ORDER — LIDOCAINE-EPINEPHRINE 2 %-1:100000 IJ SOLN
INTRAMUSCULAR | Status: AC
  Administered 2014-04-27: 1 mL
  Filled 2014-04-27: qty 1

## 2014-04-27 NOTE — ED Provider Notes (Signed)
CSN: [id number]     Arrival date & time 04/27/14  1113 History   First MD Initiated Contact with Patient 04/27/14 1135     Chief Complaint  Patient presents with  . Fall  . Extremity Laceration     (Consider location/radiation/quality/duration/timing/severity/associated sxs/prior Treatment) Patient is a 81 y.o. female presenting with fall.  Fall Pertinent negatives include no numbness.   Linda Davila is a 81 year old female past medical history of hypertension, hypercholesterolemia, thyroid disease, status post CVA who presents to ER after fall. Patient reports she was transferring from her bed to her chair, was unable to bear weight on her left leg properly and hit her arm on a wooden table. Patient reports laceration to her elbow, and mild pain associated. Patient denies having any other injuries, denies loss of consciousness. She states she was unable to stand on her leg due to baseline weakness in her left side status post CVA. Patient denies headache, neck pain, blurred vision, dizziness, weakness, back pain.  Past Medical History  Diagnosis Date  . Hypertension   . Hypercholesteremia   . Thyroid disease   . Vertigo   . Cancer     skin/ on back  . Heart murmur   . PONV (postoperative nausea and vomiting)     N/V after Breast surgery  . Hypothyroidism     takes Synthroid   Past Surgical History  Procedure Laterality Date  . Cholecystectomy    . Appendectomy    . Mastectomy    . Abdominal hysterectomy    . Hemorroidectomy    . Bilateral veins stripping 1968    . Eye surgery      cataracts removed both eyes  . Ventriculoperitoneal shunt  05/21/2011    Procedure: SHUNT INSERTION VENTRICULAR-PERITONEAL;  Surgeon: Erline Levine, MD;  Location: [facility];  Service: Neurosurgery;  Laterality: N/A;  laparoscopic Ventricular Peritoneal shunt placement with Dr Zella Richer   Family History  Problem Relation Age of Onset  . Cancer Daughter     breast  . Anesthesia problems Neg  Hx   . Hypotension Neg Hx   . Malignant hyperthermia Neg Hx   . Pseudochol deficiency Neg Hx   . Stroke Father   . Heart disease Mother    History  Substance Use Topics  . Smoking status: Never Smoker   . Smokeless tobacco: Not on file  . Alcohol Use: No   OB History    No data available     Review of Systems  Skin: Positive for wound.  Neurological: Negative for numbness.      Allergies  Review of patient's allergies indicates no known allergies.  Home Medications   Prior to Admission medications   Medication Sig Start Date End Date Taking? Authorizing Provider  acetaminophen (TYLENOL) 500 MG tablet Take 500 mg by mouth every 4 (four) hours as needed for mild pain.   Yes Historical Provider, MD  alum & mag hydroxide-simeth (MAALOX/MYLANTA) 200-200-20 MG/5ML suspension Take 30 mLs by mouth every 6 (six) hours as needed for indigestion or heartburn.   Yes Historical Provider, MD  aspirin EC 81 MG tablet Take 81 mg by mouth every morning.    Yes Historical Provider, MD  bimatoprost (LUMIGAN) 0.03 % ophthalmic solution Place 1 drop into both eyes at bedtime.   Yes Historical Provider, MD  cholecalciferol (VITAMIN D) 1000 UNITS tablet Take 1,000 Units by mouth every morning.   Yes Historical Provider, MD  clotrimazole (LOTRIMIN) 1 % cream Apply 1 application  topically 2 (two) times daily.   Yes Historical Provider, MD  guaifenesin (ROBITUSSIN) 100 MG/5ML syrup Take 200 mg by mouth 4 (four) times daily as needed for cough.   Yes Historical Provider, MD  levothyroxine (SYNTHROID, LEVOTHROID) 75 MCG tablet Take 75 mcg by mouth every morning.    Yes Historical Provider, MD  lisinopril (PRINIVIL,ZESTRIL) 20 MG tablet Take 20 mg by mouth every morning.    Yes Historical Provider, MD  loperamide (IMODIUM) 2 MG capsule Take 2 mg by mouth as needed for diarrhea or loose stools.   Yes Historical Provider, MD  lovastatin (MEVACOR) 20 MG tablet Take 20 mg by mouth at bedtime.   Yes  Historical Provider, MD  magnesium hydroxide (MILK OF MAGNESIA) 400 MG/5ML suspension Take 30 mLs by mouth daily as needed for mild constipation.   Yes Historical Provider, MD  miconazole (MICOTIN) 2 % cream Apply 1 application topically 2 (two) times daily as needed (redness/irritation).   Yes Historical Provider, MD  polyethylene glycol (MIRALAX / GLYCOLAX) packet Take 17 g by mouth daily as needed for mild constipation.   Yes Historical Provider, MD  polyvinyl alcohol (LIQUIFILM TEARS) 1.4 % ophthalmic solution Place 1 drop into both eyes 3 (three) times daily as needed for dry eyes.   Yes Historical Provider, MD  timolol (BETIMOL) 0.25 % ophthalmic solution Place 1 drop into the right eye 2 (two) times daily.    Yes Historical Provider, MD   BP 156/86 mmHg  Pulse 107  Temp(Src) 98.1 F (36.7 C)  Resp 18  SpO2 95% Physical Exam  Constitutional: She is oriented to person, place, and time. She appears well-developed and well-nourished. No distress.  HENT:  Head: Normocephalic and atraumatic.  Eyes: Right eye exhibits no discharge. Left eye exhibits no discharge. No scleral icterus.  Neck: Normal range of motion.  Pulmonary/Chest: Effort normal. No respiratory distress.  Musculoskeletal: Normal range of motion.  8-10 cm laceration deep into the fascia layer noted to proximal forearm just distal to the elbow  Neurological: She is alert and oriented to person, place, and time.  Skin: Skin is warm and dry. She is not diaphoretic.  Psychiatric: She has a normal mood and affect.  Nursing note and vitals reviewed.   ED Course  LACERATION REPAIR Date/Time: 04/27/2014 5:51 PM Performed by: Dahlia Bailiff Authorized by: Dahlia Bailiff Consent: Verbal consent obtained. Risks and benefits: risks, benefits and alternatives were discussed Consent given by: patient Body area: upper extremity Laceration length: 8 cm Foreign bodies: no foreign bodies Tendon involvement: none Nerve involvement:  none Vascular damage: no Anesthesia: local infiltration Local anesthetic: lidocaine 2% with epinephrine Anesthetic total: 8 ml Patient sedated: no Preparation: Patient was prepped and draped in the usual sterile fashion. Irrigation solution: saline Irrigation method: jet lavage Amount of cleaning: standard Debridement: minimal Degree of undermining: none Skin closure: 5-0 Prolene Subcutaneous closure: 4-0 Chromic gut Fascia closure: 4-0 Vicryl Number of sutures: 21 Technique: simple Approximation: close Approximation difficulty: complex Dressing: 4x4 sterile gauze Patient tolerance: Patient tolerated the procedure well with no immediate complications Comments: 4 fascia 4-0 chromic gut.  4 SQ 4-0 chromic gut.  13 superficial 5-0 prolene.    (including critical care time) Labs Review Labs Reviewed  RAPID HIV SCREEN (HIV 1/2 AB+AG)  HEPATITIS B SURFACE ANTIGEN  HEPATITIS C ANTIBODY (REFLEX)    Imaging Review Dg Elbow Complete Left  04/27/2014   CLINICAL DATA:  Fall, left elbow laceration, trauma, pain  EXAM: LEFT ELBOW - COMPLETE  3+ VIEW  COMPARISON:  None.  FINDINGS: Diffuse left elbow soft tissue swelling along the radial side with an overlying dressing. Bones are osteopenic. Normal alignment without acute osseous finding, fracture or effusion.  IMPRESSION: Soft tissue injury/ swelling.  Osteopenia  No acute osseous finding   Electronically Signed   By: Jerilynn Mages.  Shick M.D.   On: 04/27/2014 13:12   Dg Shoulder Left  04/27/2014   CLINICAL DATA:  Fall, left shoulder injury, pain  EXAM: LEFT SHOULDER - 2+ VIEW  COMPARISON:  04/27/2014  FINDINGS: There is no evidence of fracture or dislocation. There is no evidence of arthropathy or other focal bone abnormality. Soft tissues are unremarkable. Bones are osteopenic. No significant arthropathy.  IMPRESSION: No acute finding.   Electronically Signed   By: Jerilynn Mages.  Shick M.D.   On: 04/27/2014 13:08     EKG Interpretation None      MDM    Final diagnoses:  Fall  Laceration    Patient here status post fall with resulting laceration to left forearm and elbow. Radiographs unremarkable for acute pathology. She neurovascularly intact. Tdap booster given. Wound cleaning complete with pressure irrigation, bottom of wound visualized, no foreign bodies appreciated. Laceration occurred < 8 hours prior to repair which was well tolerated. Pt has no co morbidities to effect normal wound healing. Discussed suture home care w pt and answered questions. Pt to f-u for wound check and suture removal in 12-14 days. Pt is hemodynamically stable w no complaints prior to dc.   BP 156/86 mmHg  Pulse 107  Temp(Src) 98.1 F (36.7 C)  Resp 18  SpO2 95%  Signed,  Dahlia Bailiff, PA-C 6:24 PM    Dahlia Bailiff, PA-C 04/27/14 1824  Daleen Bo, MD 04/27/14 2014

## 2014-04-27 NOTE — Discharge Instructions (Signed)
Return to the ER in 12-14 days for suture removal.  Keep sutures dry for 24 hours.  After that, you may use soap and water to clean the wound, however DO NOT SCRUB.   Return to the ER sooner if you develop any thick white/yellow/green discharge, or begin to run a fever.  Follow up with your primary care doctor.   Laceration Care, Adult A laceration is a cut or lesion that goes through all layers of the skin and into the tissue just beneath the skin. TREATMENT  Some lacerations may not require closure. Some lacerations may not be able to be closed due to an increased risk of infection. It is important to see your caregiver as soon as possible after an injury to minimize the risk of infection and maximize the opportunity for successful closure. If closure is appropriate, pain medicines may be given, if needed. The wound will be cleaned to help prevent infection. Your caregiver will use stitches (sutures), staples, wound glue (adhesive), or skin adhesive strips to repair the laceration. These tools bring the skin edges together to allow for faster healing and a better cosmetic outcome. However, all wounds will heal with a scar. Once the wound has healed, scarring can be minimized by covering the wound with sunscreen during the day for 1 full year. HOME CARE INSTRUCTIONS  For sutures or staples:  Keep the wound clean and dry.  If you were given a bandage (dressing), you should change it at least once a day. Also, change the dressing if it becomes wet or dirty, or as directed by your caregiver.  Wash the wound with soap and water 2 times a day. Rinse the wound off with water to remove all soap. Pat the wound dry with a clean towel.  After cleaning, apply a thin layer of the antibiotic ointment as recommended by your caregiver. This will help prevent infection and keep the dressing from sticking.  You may shower as usual after the first 24 hours. Do not soak the wound in water until the sutures are  removed.  Only take over-the-counter or prescription medicines for pain, discomfort, or fever as directed by your caregiver.  Get your sutures or staples removed as directed by your caregiver. For skin adhesive strips:  Keep the wound clean and dry.  Do not get the skin adhesive strips wet. You may bathe carefully, using caution to keep the wound dry.  If the wound gets wet, pat it dry with a clean towel.  Skin adhesive strips will fall off on their own. You may trim the strips as the wound heals. Do not remove skin adhesive strips that are still stuck to the wound. They will fall off in time. For wound adhesive:  You may briefly wet your wound in the shower or bath. Do not soak or scrub the wound. Do not swim. Avoid periods of heavy perspiration until the skin adhesive has fallen off on its own. After showering or bathing, gently pat the wound dry with a clean towel.  Do not apply liquid medicine, cream medicine, or ointment medicine to your wound while the skin adhesive is in place. This may loosen the film before your wound is healed.  If a dressing is placed over the wound, be careful not to apply tape directly over the skin adhesive. This may cause the adhesive to be pulled off before the wound is healed.  Avoid prolonged exposure to sunlight or tanning lamps while the skin adhesive is in place.  Exposure to ultraviolet light in the first year will darken the scar.  The skin adhesive will usually remain in place for 5 to 10 days, then naturally fall off the skin. Do not pick at the adhesive film. You may need a tetanus shot if:  You cannot remember when you had your last tetanus shot.  You have never had a tetanus shot. If you get a tetanus shot, your arm may swell, get red, and feel warm to the touch. This is common and not a problem. If you need a tetanus shot and you choose not to have one, there is a rare chance of getting tetanus. Sickness from tetanus can be serious. SEEK  MEDICAL CARE IF:   You have redness, swelling, or increasing pain in the wound.  You see a red line that goes away from the wound.  You have yellowish-white fluid (pus) coming from the wound.  You have a fever.  You notice a bad smell coming from the wound or dressing.  Your wound breaks open before or after sutures have been removed.  You notice something coming out of the wound such as wood or glass.  Your wound is on your hand or foot and you cannot move a finger or toe. SEEK IMMEDIATE MEDICAL CARE IF:   Your pain is not controlled with prescribed medicine.  You have severe swelling around the wound causing pain and numbness or a change in color in your arm, hand, leg, or foot.  Your wound splits open and starts bleeding.  You have worsening numbness, weakness, or loss of function of any joint around or beyond the wound.  You develop painful lumps near the wound or on the skin anywhere on your body. MAKE SURE YOU:   Understand these instructions.  Will watch your condition.  Will get help right away if you are not doing well or get worse. Document Released: 01/25/2005 Document Revised: 04/19/2011 Document Reviewed: 07/21/2010 [facility] Patient Information 2015 [place], [place]. This information is not intended to replace advice given to you by your health care provider. Make sure you discuss any questions you have with your health care provider.  Sutured Wound Care Sutures are stitches that can be used to close wounds. Wound care helps prevent pain and infection.  HOME CARE INSTRUCTIONS   Rest and elevate the injured area until all the pain and swelling are gone.  Only take over-the-counter or prescription medicines for pain, discomfort, or fever as directed by your caregiver.  After 48 hours, gently wash the area with mild soap and water once a day, or as directed. Rinse off the soap. Pat the area dry with a clean towel. Do not rub the wound. This may cause  bleeding.  Follow your caregiver's instructions for how often to change the bandage (dressing). Stop using a dressing after 2 days or after the wound stops draining.  If the dressing sticks, moisten it with soapy water and gently remove it.  Apply ointment on the wound as directed.  Avoid stretching a sutured wound.  Drink enough fluids to keep your urine clear or pale yellow.  Follow up with your caregiver for suture removal as directed.  Use sunscreen on your wound for the next 3 to 6 months so the scar will not darken. SEEK IMMEDIATE MEDICAL CARE IF:   Your wound becomes red, swollen, hot, or tender.  You have increasing pain in the wound.  You have a red streak that extends from the wound.  There  is pus coming from the wound.  You have a fever.  You have shaking chills.  There is a bad smell coming from the wound.  You have persistent bleeding from the wound. MAKE SURE YOU:   Understand these instructions.  Will watch your condition.  Will get help right away if you are not doing well or get worse. Document Released: 03/04/2004 Document Revised: 04/19/2011 Document Reviewed: 05/31/2010 [facility] Patient Information 2015 [place], [place]. This information is not intended to replace advice given to you by your health care provider. Make sure you discuss any questions you have with your health care provider.

## 2014-04-27 NOTE — ED Notes (Signed)
Bed: WHALD Expected date:  Expected time:  Means of arrival:  Comments: 

## 2014-04-27 NOTE — ED Notes (Signed)
PA at bedside repairing laceration

## 2014-04-27 NOTE — ED Notes (Signed)
EMS reports pt was assisted to chair, fell and hit left arm on wooden table, No LOC, laceration to left arm with bleeding controlled, stroke a few weeks ago, pt resides at [facility] on [address].

## 2014-04-27 NOTE — ED Provider Notes (Signed)
  Face-to-face evaluation   History: Accidental fall, while being transferred without appropriate assistance. Injury to left elbow, isolated   Physical exam: Alert, lucid, left arm is pruritic. Large laceration over the dorsal proximal forearm. The wound is gaping. There are no visible bone or bone fragments. She has intact distal sensation, and circulation at the wrist.  Medical screening examination/treatment/procedure(s) were conducted as a shared visit with non-physician practitioner(s) and myself.  I personally evaluated the patient during the encounter  Daleen Bo, MD 04/27/14 2014

## 2014-04-27 NOTE — ED Notes (Signed)
[facility] called for transportation to [facility]

## 2014-04-27 NOTE — ED Notes (Signed)
Bed: TM21 Expected date: 04/27/14 Expected time: 11:06 AM Means of arrival: Ambulance Comments: fall

## 2014-04-30 LAB — HEPATITIS C ANTIBODY (REFLEX): HCV Ab: NEGATIVE

## 2014-06-09 NOTE — H&P (Signed)
PATIENT NAME:  Linda Davila, Linda Davila MR#:  [id number] DATE OF BIRTH:  05-Feb-1931  DATE OF ADMISSION:  04/13/2014  REFERRING PHYSICIAN: Dr. Baker Janus.   FAMILY PHYSICIAN: Passenger transport manager [employer].    REASON FOR ADMISSION: Left facial droop.   HISTORY OF PRESENT ILLNESS: The patient is an 81 year old female who resides at [facility], being followed by [facility]. The patient has a significant history of normal pressure hydrocephalus status post shunt placement. Also has chronic left-sided weakness due to previous trauma. No history of previous stroke. Does have chronic dysphagia. Presents to the Emergency Room today with acute left facial droop. In the Emergency Room, the patient states that her symptoms have been improving. She has been complaining of some nausea and her blood pressures have been elevated at her assisted living facility. She is now admitted for further evaluation.   PAST MEDICAL HISTORY: 1.  History of normal pressure hydrocephalus status post shunt placement.  2.  Chronic ataxia.  3.  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  4.  Chronic dysphagia.  5.  Hyperlipidemia.  6.  Hypothyroidism.  7.  Benign hypertension.  8.  Diverticulosis.  9.  Remote history of breast cancer.  10.   Glaucoma.   MEDICATIONS ON ADMISSION:  1.  Vitamin D3 at 1000 units p.o. daily.  2.  Timolol eye drops as directed.  3.  Meloxicam 7.5 mg p.o. daily as needed.  4.  Lumigan eyedrops as directed.  5.  Lovastatin 20 mg p.o. daily.  6.  Lisinopril 20 mg p.o. daily.  7.  Synthroid 75 mcg p.o. daily.   ALLERGIES: PEANUTS AND GRAINS.   SOCIAL HISTORY: Negative for alcohol or tobacco abuse.   FAMILY HISTORY: Positive for hypertension and diabetes, otherwise unremarkable.   REVIEW OF SYSTEMS: CONSTITUTIONAL: No fever or change in weight.  EYES: No blurred or double vision. Does have glaucoma.  ENT: No tinnitus or hearing loss. No nasal discharge or bleeding. Has  chronic difficulty swallowing.  RESPIRATORY: No cough or wheezing. Denies hemoptysis. No painful respiration.  CARDIOVASCULAR: No chest pain or orthopnea. No palpitations or syncope.  GASTROINTESTINAL: Some nausea but no vomiting or diarrhea. Denies abdominal pain or change in bowel habits.  GENITOURINARY: No dysuria or hematuria. No incontinence.  ENDOCRINE: No polyuria or polydipsia. No heat or cold intolerance.  HEMATOLOGICAL: The patient denies anemia, easy bruising or bleeding.  LYMPHATIC: No swollen glands.  MUSCULOSKELETAL: The patient denies pain in her neck, back, shoulders, knees, or hips. No gout.  NEUROLOGIC: No numbness or migraines. Denies stroke or seizures.  PSYCHIATRIC: The patient denies anxiety, insomnia or depression.   PHYSICAL EXAMINATION: GENERAL: The patient is chronically ill-appearing, in no acute distress.  VITAL SIGNS: Remarkable for a blood pressure of 142/65 with a heart rate of 95, respiratory rate of 18, temperature of 98, sat 96% on room air.  HEENT: Normocephalic, atraumatic. Pupils equal, round, and reactive to light and accommodation. Extraocular movements are intact. Sclerae are anicteric. Conjunctivae are clear. Oropharynx is clear. A faint left facial droop is noted.  NECK: Supple without JVD or bruits. No adenopathy or thyromegaly is noted.  LUNGS: Clear to auscultation and percussion without wheezes, rales or rhonchi. No dullness. Respiratory effort is normal.  CARDIAC: Regular rate and rhythm with normal S1, S2. No significant rubs, murmurs or gallops. PMI is nondisplaced. Chest wall is nontender.  ABDOMEN: Soft, nontender, with normoactive bowel sounds. No organomegaly or masses appreciated. No hernias or bruits were noted.  EXTREMITIES: Without clubbing, cyanosis or edema. Pulses were 2+ bilaterally.  SKIN: Warm and dry without rash or lesions.  NEUROLOGIC: Revealed mild left-sided weakness with a left facial droop. Stasis changes were noted with  trace edema.  PSYCHIATRIC: Revealed a patient who was alert and oriented to person, place, and time. She was cooperative and used good judgment.   LABORATORY DATA: EKG revealed sinus rhythm with no acute ischemic changes. Head CT revealed previous shunt placement with no acute intracranial abnormality. Chest x-ray was unremarkable. White count was 13 with a hemoglobin of 14.5. Glucose 89 with a BUN of 11, creatinine of 0.69 with a sodium of 141, and a potassium of 3.6. Troponin was less than 0.02.   ASSESSMENT: 1.  Left facial droop which is resolving, worrisome for transient ischemic attack.  2.  Previous hydrocephalus status post shunt placement.  3.  Benign hypertension.  4.  Hypothyroidism.  5.  Hyperlipidemia.  6.  Chronic dysphagia.   PLAN: The patient will be observed on telemetry. We will obtain neurology consult and check carotid ultrasound. Will begin aspirin therapy at this time. Neuro-checks q. 4 hours. Further treatment and evaluation will depend upon the patient's progress.   TOTAL TIME SPENT ON THIS PATIENT: 45 minutes.    ____________________________ Leonie Douglas Doy Hutching, MD jds:at D: 04/13/2014 16:11:31 ET T: 04/13/2014 17:55:05 ET JOB#: [id number]  cc: Leonie Douglas. Doy Hutching, MD, <Dictator> Lakhia Gengler Lennice Sites MD ELECTRONICALLY SIGNED 04/13/2014 20:23

## 2014-06-09 NOTE — Discharge Summary (Signed)
Davila NAME:  Linda Davila, Linda Davila MR#:  [id number] DATE OF BIRTH:  04/08/1930  DATE OF ADMISSION:  04/13/2014 DATE OF DISCHARGE:  04/16/2014  PRIMARY CARE PHYSICIAN:  [facility]  FINAL DIAGNOSES: 1.  Acute cerebrovascular accident, brainstem.  2.  Cystitis without hematuria.  3.  Essential hypertension.  4.  Hypothyroidism.  5.  Hyperlipidemia, unspecified.   MEDICATIONS ON DISCHARGE:  Include levothyroxine 75 mcg once a day in Linda morning; Lumigan 0.01%; timolol 0.25% ophthalmic solution to right eye twice a day; vitamin D3, 1000 international units daily; Tylenol 500 mg every 4 hours as needed for fever; Milk of Magnesia 8% 30 mL once a day at bedtime as needed for constipation; lovastatin 20 mg daily; lisinopril 20 mg daily; MiraLax 17 grams as needed for constipation; clotrimazole 1% topical cream to groin twice a day until healed; Baza Protect topical cream applied to affected area twice a day as needed for redness or open areas; liquid tears preserved ophthalmic solution one drop 3 times a day as needed for dry eyes; Robitussin 10 mL every 6 hours as needed for cough; Geri-Lanta 200 mg 20 mg/5 mL oral suspension 30 mL orally every 6 hours as needed for indigestion or heartburn; loperamide 2 mg every 3 hours for each loose stool as needed for diarrhea; aspirin 81 mg daily; and cephalexin 500 mg every 8 hours for 4 days.   HOME HEALTH:  Yes, physical therapy.   DIET:  Low sodium, regular consistency, nectar-thick liquids, aspiration precautions, feeding assistance at all meals, medications in puree.  FOLLOWUP:  One to 2 weeks with [facility] or physician over at [facility] assisted living facility.   HOSPITAL COURSE:  Linda Davila was admitted as an observation on 04/13/2014, and physically discharged on 04/16/2014. She came in with left facial droop, suspected TIA.   Laboratory and radiological data during Linda hospital course included a CT scan of Linda head that showed no  acute intracranial abnormality, cerebral atrophy and small vessel ischemic changes, and no change in VP shunt catheter position. Chest x-ray:  No active disease. EKG showed right bundle branch block and left anterior fascicular block. Troponin was negative. Glucose was 89, BUN 11, creatinine 0.69, sodium 141, potassium 3.6, chloride 103, CO2 was 31, and calcium was 8.7. Liver function tests were in normal range. PT and INR were in normal range. White blood cell count was 13.0, hemoglobin and hematocrit 14.5 and 44.2, and platelet count was 341,000. Urinalysis showed 2+ leukocyte esterase. Ultrasound of Linda carotid:  No evidence of stenosis. LDL was 86, HDL 40, triglycerides 98. White blood cell count upon discharge was 8.0. Creatinine was 0.65.   Hospital course per problem list:   1.  For Linda Davila's acute cerebrovascular accident of Linda brainstem, this was suspected by neurologist Dr. Irish Elders, who believed no need to repeat a CT scan, unable to get an MRI secondary to VP shunt history. Linda Davila does have chronic left-sided weakness. Linda Davila had a facial droop. She was put on a dysphagia diet, started on aspirin, and continued on cholesterol medication. Cholesterol profile was appropriate. Disposition was Linda issue. Linda Davila was from an assisted living facility. Linda daughter wanted Linda Davila closer to her, and Linda Davila was not able to be accepted to Linda facility closer to her until 04/16/2014. Linda Davila was discharged to that facility. PPD can be read at that facility.  2.  Cystitis without hematuria. Unfortunately, a urine culture was not done on admission.  Linda Davila was started on Rocephin and switched over to Keflex and will finish up a course.  3.  Essential hypertension. No changes in medications were made.  4.  Hypothyroidism, unspecified, on levothyroxine.  5.  Hyperlipidemia, unspecified, on statin with acceptable LDL.  6.  History of hydrocephalus status post  ventriculoperitoneal shunt. Neurologist, Dr. Irish Elders, mentioned in his note that Linda Davila has a lot of atrophy and large ventricles. This could be a possibility that Linda shunt could be failing. They can do a shunt study and look at CSF flow such as a CT myelogram if needed and can be done as an outpatient, so I will leave that up to primary care physician to decide on that.   TIME SPENT ON DISCHARGE:  20 minutes.     ____________________________ Tana Conch. Leslye Peer, MD rjw:nb D: 04/16/2014 14:56:00 ET T: 04/17/2014 00:15:53 ET JOB#: [id number]  cc: Tana Conch. Leslye Peer, MD, <Dictator> [facility] MD ELECTRONICALLY SIGNED 04/17/2014 10:55

## 2014-06-09 NOTE — Consult Note (Signed)
PATIENT NAME:  Linda Davila, Linda Davila MR#:  [id number] DATE OF BIRTH:  27-Nov-1930  DATE OF CONSULTATION:  04/14/2014  REFERRING PHYSICIAN:   CONSULTING PHYSICIAN:  Leotis Pain, MD  REASON FOR CONSULTATION: Left facial droop and left tongue deviation.   HISTORY OF PRESENT ILLNESS: This is an 81 year old female, resident of [facility]. The patient has history of normal pressure hydrocephalus, status post shunt placement, as per patient it was sometime in 2013. The patient also has chronic left-sided weakness due to prior trauma. At baseline, the patient does not ambulate by herself, presenting with suspected facial droop and questionable left tongue deviation which has improved and the patient is close to baseline. As per patient, she states she is back to her normal self. CAT scan of the head: No acute intracranial abnormality was found.   PAST MEDICAL HISTORY: Normal pressure hydrocephalus, status post shunt placement, chronic ataxia, chronic left-sided weakness, chronic dysphagia, hyperlipidemia, hypothyroidism, benign hypertension, diverticulosis, remote history of breast cancer and glaucoma.   HOME MEDICATIONS: Have been reviewed. The patient is not on any antiplatelet therapy.   ALLERGIES: INCLUDE PEANUTS AND GRAINS.   SOCIAL HISTORY: Negative for EtOH or tobacco use.   FAMILY HISTORY: Positive for hypertension and diabetes.  REVIEW OF SYSTEMS: No shortness of breath. No chest pain. No abdominal pain. Chronic left-sided weakness. No anxiety. No depression. No heat or cold intolerance. No diarrhea or constipation.   NEUROLOGICAL EVALUATION: The patient awake, alert and oriented to time, place, location. Mild left facial droop noted. Tongue is midline. Uvula elevates symmetrically. Shoulder shrug intact. Drift in the left upper extremity, which is chronic as per patient. Otherwise, 4+/5 in the upper extremities and significant weakness in the lower extremities. The patient does not  ambulate by herself. Reflexes diminished. Sensation appears to be intact. Gait not assessed.   IMPRESSION: An 81 year old female with normal pressure hydrocephalus, status post intraperitoneal shunt around 2013, presents with questionable left facial droop that has improved. Suspect there is a possibility of small brain stem infarct. The patient's CAT scan did not show any acute intracranial abnormality. The patient was found to have a urinary tract infection, currently being treated with Rocephin. The patient was not on any antiplatelet therapy. The patient is currently on aspirin 81 mg daily. The reason for no antiplatelet therapy, the patient states that her primary physician took off a long time ago. Looking at the CAT scan, the patient has a lot of atrophy, large ventricles. There is a possibility that the shunt could be failing as well, since we do not know the pressure that it is set on. In order to do so, would follow up as an outpatient for possibility of shunt series studies and look at CSF flow, such as CT myelogram if needed and can be done as an outpatient. Otherwise, from a neurological standpoint, would do discharge planning. No further neurological imaging at this point.   Thank you, it was a pleasure seeing this patient.    ____________________________ Leotis Pain, MD yz:TM D: 04/14/2014 16:12:17 ET T: 04/14/2014 17:19:15 ET JOB#: [id number]  cc: Leotis Pain, MD, <Dictator> Leotis Pain MD ELECTRONICALLY SIGNED 04/25/2014 12:11

## 2015-09-10 IMAGING — CR DG ELBOW COMPLETE 3+V*L*
4 series · 4 of 4 positions shown · non-contrast
Comparison: None.

CLINICAL DATA: Fall, left elbow laceration, trauma, pain

EXAM:
LEFT ELBOW - COMPLETE 3+ VIEW

[x elbow ap left]
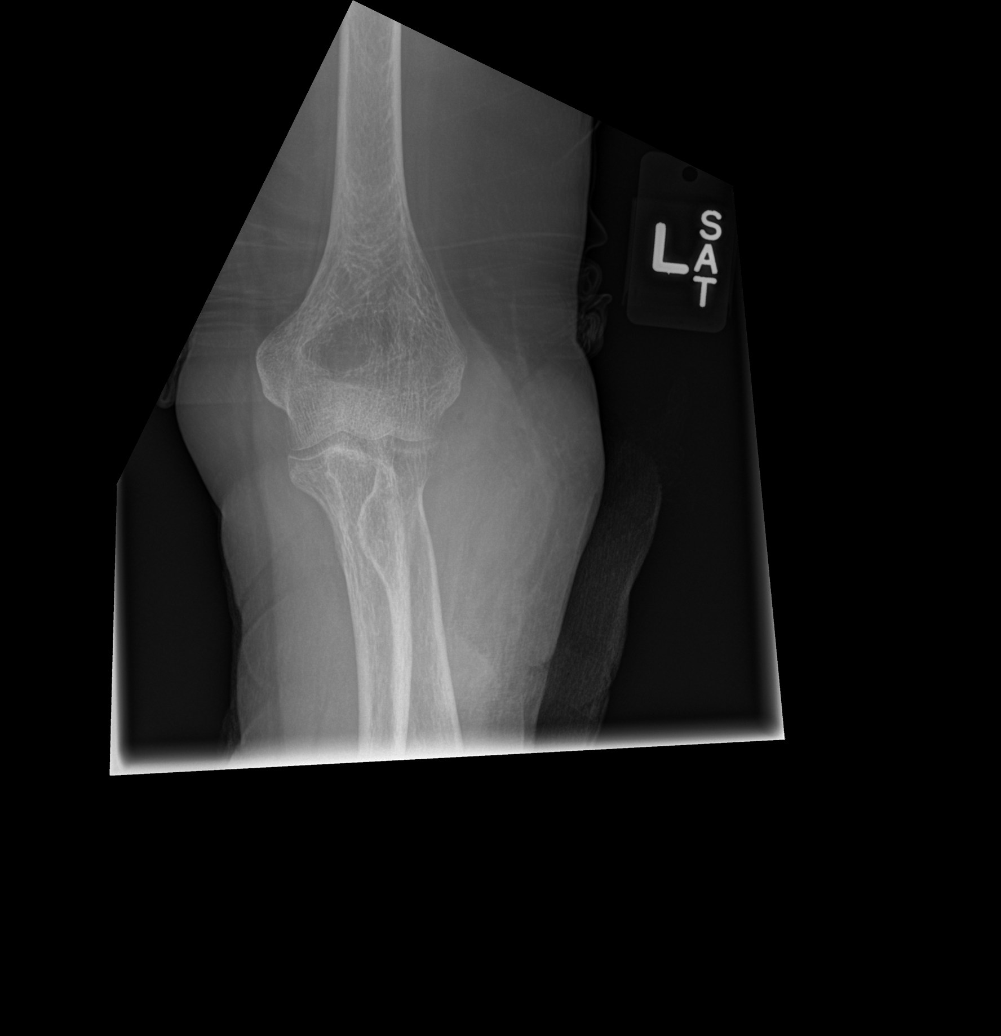

[x elbow obl left (1 of 2)]
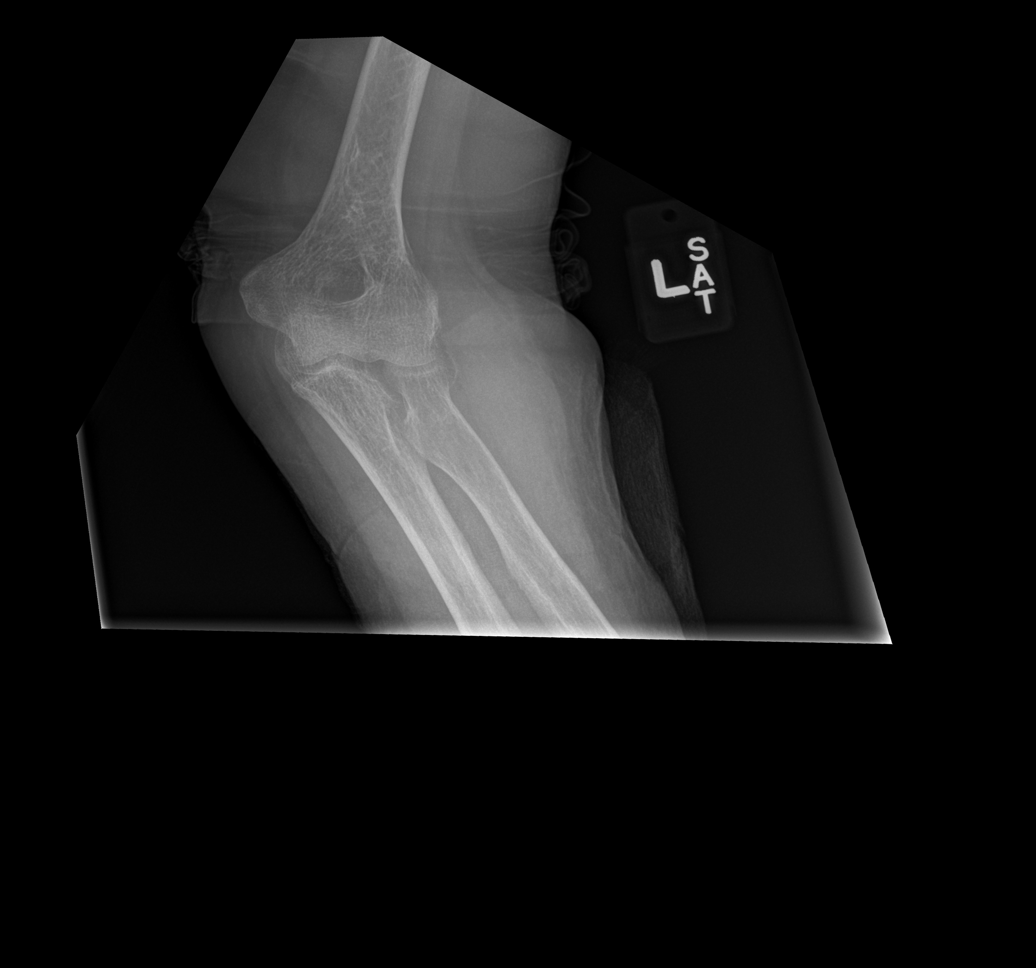

[x elbow obl left (2 of 2)]
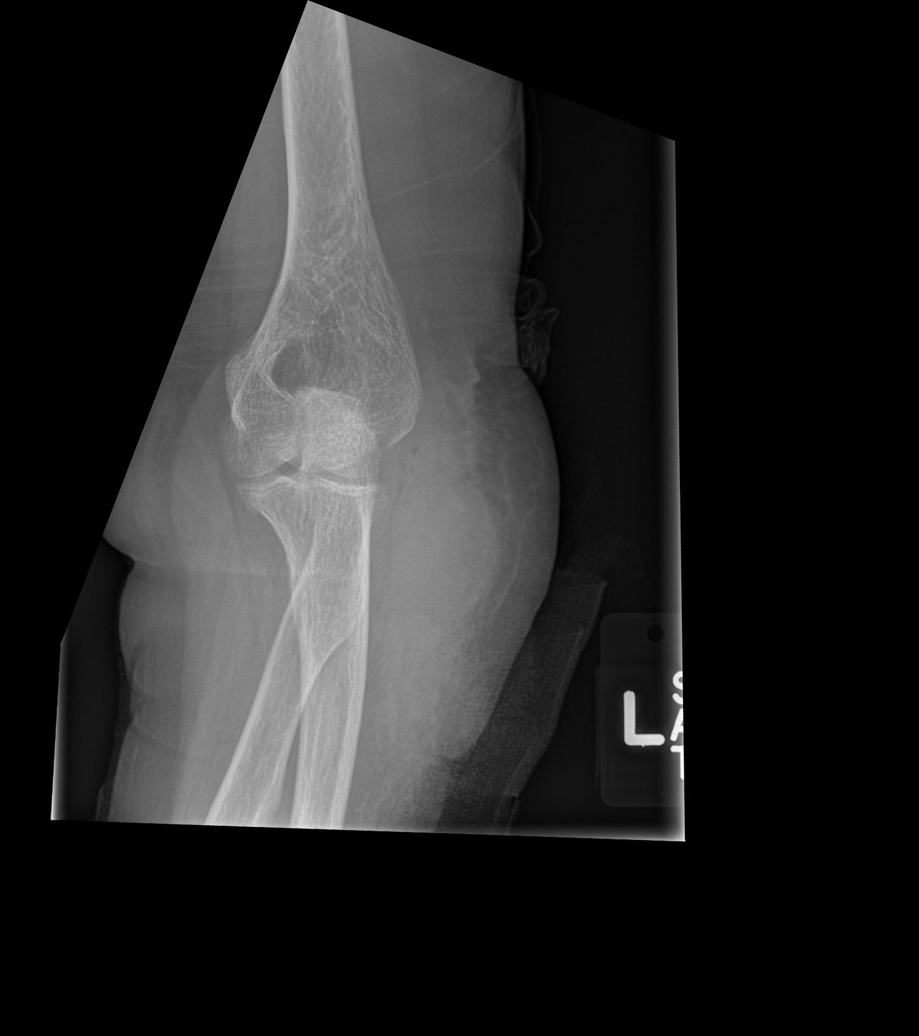

[x elbow lat left]
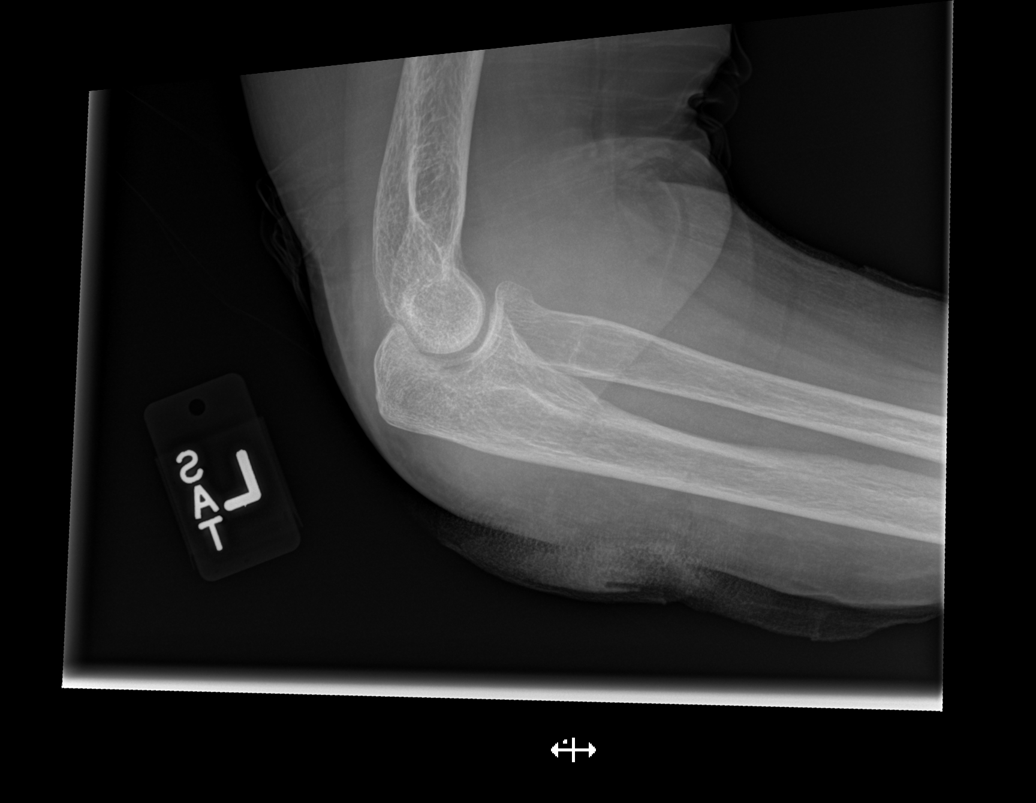

[4 of 4 positions shown; findings below may reference images not displayed]

FINDINGS: Diffuse left elbow soft tissue swelling along the radial side with
an overlying dressing. Bones are osteopenic. Normal alignment
without acute osseous finding, fracture or effusion.
IMPRESSION: Soft tissue injury/ swelling.

Osteopenia

No acute osseous finding

## 2015-10-01 ENCOUNTER — Encounter: Payer: Self-pay | Admitting: Internal Medicine

## 2016-02-28 ENCOUNTER — Encounter (HOSPITAL_COMMUNITY): Payer: Self-pay | Admitting: *Deleted

## 2016-02-28 ENCOUNTER — Inpatient Hospital Stay (HOSPITAL_COMMUNITY): Payer: Medicare Other

## 2016-02-28 ENCOUNTER — Inpatient Hospital Stay (HOSPITAL_COMMUNITY)
Admission: AD | Admit: 2016-02-28 | Discharge: 2016-03-02 | DRG: 469 | Disposition: A | Discharge status: Skilled Nursing Facility | Payer: Medicare Other | Source: Ambulatory Visit | Attending: Internal Medicine | Admitting: Internal Medicine

## 2016-02-28 DIAGNOSIS — E876 Hypokalemia: Secondary | ICD-10-CM | POA: Diagnosis present

## 2016-02-28 DIAGNOSIS — E039 Hypothyroidism, unspecified: Secondary | ICD-10-CM

## 2016-02-28 DIAGNOSIS — Z9841 Cataract extraction status, right eye: Secondary | ICD-10-CM

## 2016-02-28 DIAGNOSIS — Z9049 Acquired absence of other specified parts of digestive tract: Secondary | ICD-10-CM

## 2016-02-28 DIAGNOSIS — D72829 Elevated white blood cell count, unspecified: Secondary | ICD-10-CM | POA: Diagnosis present

## 2016-02-28 DIAGNOSIS — G912 (Idiopathic) normal pressure hydrocephalus: Secondary | ICD-10-CM | POA: Diagnosis present

## 2016-02-28 DIAGNOSIS — I639 Cerebral infarction, unspecified: Secondary | ICD-10-CM | POA: Diagnosis not present

## 2016-02-28 DIAGNOSIS — I10 Essential (primary) hypertension: Secondary | ICD-10-CM | POA: Diagnosis present

## 2016-02-28 DIAGNOSIS — E785 Hyperlipidemia, unspecified: Secondary | ICD-10-CM | POA: Diagnosis present

## 2016-02-28 DIAGNOSIS — W050XXA Fall from non-moving wheelchair, initial encounter: Secondary | ICD-10-CM | POA: Diagnosis present

## 2016-02-28 DIAGNOSIS — S72011A Unspecified intracapsular fracture of right femur, initial encounter for closed fracture: Secondary | ICD-10-CM | POA: Diagnosis present

## 2016-02-28 DIAGNOSIS — Z809 Family history of malignant neoplasm, unspecified: Secondary | ICD-10-CM | POA: Diagnosis not present

## 2016-02-28 DIAGNOSIS — I69354 Hemiplegia and hemiparesis following cerebral infarction affecting left non-dominant side: Secondary | ICD-10-CM | POA: Diagnosis present

## 2016-02-28 DIAGNOSIS — Z8249 Family history of ischemic heart disease and other diseases of the circulatory system: Secondary | ICD-10-CM | POA: Diagnosis not present

## 2016-02-28 DIAGNOSIS — Z7982 Long term (current) use of aspirin: Secondary | ICD-10-CM

## 2016-02-28 DIAGNOSIS — Z9842 Cataract extraction status, left eye: Secondary | ICD-10-CM | POA: Diagnosis not present

## 2016-02-28 DIAGNOSIS — Z982 Presence of cerebrospinal fluid drainage device: Secondary | ICD-10-CM

## 2016-02-28 DIAGNOSIS — Z66 Do not resuscitate: Secondary | ICD-10-CM | POA: Diagnosis present

## 2016-02-28 DIAGNOSIS — S72001A Fracture of unspecified part of neck of right femur, initial encounter for closed fracture: Secondary | ICD-10-CM | POA: Diagnosis not present

## 2016-02-28 DIAGNOSIS — Z90710 Acquired absence of both cervix and uterus: Secondary | ICD-10-CM

## 2016-02-28 DIAGNOSIS — S72002A Fracture of unspecified part of neck of left femur, initial encounter for closed fracture: Secondary | ICD-10-CM | POA: Diagnosis present

## 2016-02-28 DIAGNOSIS — Z96649 Presence of unspecified artificial hip joint: Secondary | ICD-10-CM

## 2016-02-28 DIAGNOSIS — Z79899 Other long term (current) drug therapy: Secondary | ICD-10-CM | POA: Diagnosis not present

## 2016-02-28 DIAGNOSIS — Z823 Family history of stroke: Secondary | ICD-10-CM | POA: Diagnosis not present

## 2016-02-28 DIAGNOSIS — Y92129 Unspecified place in nursing home as the place of occurrence of the external cause: Secondary | ICD-10-CM | POA: Diagnosis not present

## 2016-02-28 DIAGNOSIS — Z419 Encounter for procedure for purposes other than remedying health state, unspecified: Secondary | ICD-10-CM

## 2016-02-28 DIAGNOSIS — Z993 Dependence on wheelchair: Secondary | ICD-10-CM

## 2016-02-28 DIAGNOSIS — R52 Pain, unspecified: Secondary | ICD-10-CM

## 2016-02-28 HISTORY — DX: Cerebral infarction, unspecified: I63.9

## 2016-02-28 LAB — URINALYSIS, ROUTINE W REFLEX MICROSCOPIC
Bilirubin Urine: NEGATIVE
Glucose, UA: NEGATIVE mg/dL
KETONES UR: NEGATIVE mg/dL
Leukocytes, UA: NEGATIVE
Nitrite: NEGATIVE
PROTEIN: NEGATIVE mg/dL
Specific Gravity, Urine: 1.023 (ref 1.005–1.030)
pH: 5 (ref 5.0–8.0)

## 2016-02-28 LAB — CBC
HCT: 36.7 % (ref 36.0–46.0)
Hemoglobin: 12.7 g/dL (ref 12.0–15.0)
MCH: 31.8 pg (ref 26.0–34.0)
MCHC: 34.6 g/dL (ref 30.0–36.0)
MCV: 91.8 fL (ref 78.0–100.0)
PLATELETS: 366 10*3/uL (ref 150–400)
RBC: 4 MIL/uL (ref 3.87–5.11)
RDW: 13.3 % (ref 11.5–15.5)
WBC: 9.2 10*3/uL (ref 4.0–10.5)

## 2016-02-28 LAB — CREATININE, SERUM
CREATININE: 0.54 mg/dL (ref 0.44–1.00)
GFR calc Af Amer: >60 mL/min (ref >60)

## 2016-02-28 LAB — SURGICAL PCR SCREEN
MRSA, PCR: NEGATIVE
STAPHYLOCOCCUS AUREUS: POSITIVE — AB

## 2016-02-28 MED ORDER — ONDANSETRON HCL 4 MG PO TABS: 4.0000 mg | ORAL_TABLET | Freq: Four times a day (QID) | ORAL | Status: DC | PRN

## 2016-02-28 MED ORDER — POLYVINYL ALCOHOL 1.4 % OP SOLN
1.0000 [drp] | Freq: Three times a day (TID) | OPHTHALMIC | Status: DC | PRN
  Filled 2016-02-28: qty 15

## 2016-02-28 MED ORDER — POLYETHYLENE GLYCOL 3350 17 G PO PACK: 17.0000 g | PACK | Freq: Every day | ORAL | Status: DC | PRN

## 2016-02-28 MED ORDER — ACETAMINOPHEN 650 MG RE SUPP: 650.0000 mg | Freq: Four times a day (QID) | RECTAL | Status: DC | PRN

## 2016-02-28 MED ORDER — ENOXAPARIN SODIUM 40 MG/0.4ML ~~LOC~~ SOLN: 40.0000 mg | SUBCUTANEOUS | Status: DC

## 2016-02-28 MED ORDER — ASPIRIN EC 81 MG PO TBEC: 81.0000 mg | DELAYED_RELEASE_TABLET | Freq: Every morning | ORAL | Status: DC

## 2016-02-28 MED ORDER — MAGNESIUM HYDROXIDE 400 MG/5ML PO SUSP: 30.0000 mL | Freq: Every day | ORAL | Status: DC | PRN

## 2016-02-28 MED ORDER — GUAIFENESIN 100 MG/5ML PO SOLN: 200.0000 mg | Freq: Four times a day (QID) | ORAL | Status: DC | PRN

## 2016-02-28 MED ORDER — LATANOPROST 0.005 % OP SOLN
1.0000 [drp] | Freq: Every day | OPHTHALMIC | Status: DC
  Administered 2016-02-28 – 2016-03-01 (×3): 1 [drp] via OPHTHALMIC
  Filled 2016-02-28: qty 2.5

## 2016-02-28 MED ORDER — LEVOTHYROXINE SODIUM 75 MCG PO TABS: 75.0000 ug | ORAL_TABLET | Freq: Every day | ORAL | Status: DC

## 2016-02-28 MED ORDER — LISINOPRIL 20 MG PO TABS
20.0000 mg | ORAL_TABLET | Freq: Every morning | ORAL | Status: DC
  Administered 2016-03-01 – 2016-03-02 (×2): 20 mg via ORAL
  Filled 2016-02-28 (×2): qty 1

## 2016-02-28 MED ORDER — ALUM & MAG HYDROXIDE-SIMETH 200-200-20 MG/5ML PO SUSP: 30.0000 mL | Freq: Four times a day (QID) | ORAL | Status: DC | PRN

## 2016-02-28 MED ORDER — LOPERAMIDE HCL 2 MG PO CAPS: 2.0000 mg | ORAL_CAPSULE | ORAL | Status: DC | PRN

## 2016-02-28 MED ORDER — LEVOTHYROXINE SODIUM 100 MCG PO TABS
100.0000 ug | ORAL_TABLET | Freq: Every day | ORAL | Status: DC
  Administered 2016-03-01 – 2016-03-02 (×2): 100 ug via ORAL
  Filled 2016-02-28 (×2): qty 1

## 2016-02-28 MED ORDER — PRAVASTATIN SODIUM 20 MG PO TABS
20.0000 mg | ORAL_TABLET | Freq: Every day | ORAL | Status: DC
  Administered 2016-02-28 – 2016-03-01 (×2): 20 mg via ORAL
  Filled 2016-02-28 (×2): qty 1

## 2016-02-28 MED ORDER — VITAMIN D3 25 MCG (1000 UNIT) PO TABS
1000.0000 [IU] | ORAL_TABLET | Freq: Every morning | ORAL | Status: DC
  Administered 2016-03-01 – 2016-03-02 (×2): 1000 [IU] via ORAL
  Filled 2016-02-28 (×2): qty 1

## 2016-02-28 MED ORDER — ACETAMINOPHEN 325 MG PO TABS
650.0000 mg | ORAL_TABLET | Freq: Four times a day (QID) | ORAL | Status: DC | PRN
  Administered 2016-02-28 – 2016-03-02 (×3): 650 mg via ORAL
  Filled 2016-02-28 (×3): qty 2

## 2016-02-28 MED ORDER — ONDANSETRON HCL 4 MG/2ML IJ SOLN: 4.0000 mg | Freq: Four times a day (QID) | INTRAMUSCULAR | Status: DC | PRN

## 2016-02-28 MED ORDER — MORPHINE SULFATE (PF) 2 MG/ML IV SOLN: 1.0000 mg | INTRAVENOUS | Status: DC | PRN

## 2016-02-28 MED ORDER — TIMOLOL MALEATE 0.25 % OP SOLN
1.0000 [drp] | Freq: Two times a day (BID) | OPHTHALMIC | Status: DC
  Administered 2016-02-28 – 2016-03-02 (×5): 1 [drp] via OPHTHALMIC
  Filled 2016-02-28: qty 5

## 2016-02-28 MED ORDER — DEXTROSE-NACL 5-0.9 % IV SOLN
INTRAVENOUS | Status: DC
  Administered 2016-02-28: 13:00:00 via INTRAVENOUS

## 2016-02-28 NOTE — Progress Notes (Signed)
Patient received to floor from [facility], pt from [facility]. VSS Alert to self. DR to be paged. Bethann Punches RN

## 2016-02-28 NOTE — Consult Note (Signed)
ORTHOPAEDIC CONSULTATION  REQUESTING PHYSICIAN: Tawni Millers, *  Chief Complaint: Right hip fracture  HPI: Linda Davila is a 83 y.o. female who presents with right hip fracture s/p fall on 02/07/16 at [place] [facility].  Per report, the patient endorses severe pain in the right hip, that does not radiate, grinding in quality, worse with any movement, better with immobilization.  Denies LOC/fever/chills/nausea/vomiting.  Patient is nonambulatory at baseline due to normal pressure hydrocephalus and CVA with left sided weakness.  HPI details are limited due to patient's baseline mental status.  Past Medical History:  Diagnosis Date  . Cancer ([place])    skin/ on back  . Heart murmur   . Hypercholesteremia   . Hypertension   . Hypothyroidism    takes Synthroid  . PONV (postoperative nausea and vomiting)    N/V after Breast surgery  . Stroke [facility])    stroke symptoms form encephalalotmy with hydro cephalus  . Thyroid disease   . Vertigo    Past Surgical History:  Procedure Laterality Date  . ABDOMINAL HYSTERECTOMY    . APPENDECTOMY    . Bilateral veins stripping 1968    . CHOLECYSTECTOMY    . EYE SURGERY     cataracts removed both eyes  . HEMORROIDECTOMY    . MASTECTOMY    . VENTRICULOPERITONEAL SHUNT  05/21/2011   Procedure: SHUNT INSERTION VENTRICULAR-PERITONEAL;  Surgeon: Erline Levine, MD;  Location: [place] [facility];  Service: Neurosurgery;  Laterality: N/A;  laparoscopic Ventricular Peritoneal shunt placement with Dr Zella Richer   Social History   Social History  . Marital status: Divorced    Spouse name: N/A  . Number of children: N/A  . Years of education: N/A   Social History Main Topics  . Smoking status: Never Smoker  . Smokeless tobacco: Never Used  . Alcohol use No  . Drug use: No  . Sexual activity: No   Other Topics Concern  . None   Social History Narrative  . None   Family History  Problem Relation Age of Onset  . Heart disease  Mother   . Stroke Father   . Cancer Daughter     breast  . Anesthesia problems Neg Hx   . Hypotension Neg Hx   . Malignant hyperthermia Neg Hx   . Pseudochol deficiency Neg Hx    Allergies  Allergen Reactions  . Other     nuts   Prior to Admission medications   Medication Sig Start Date End Date Taking? Authorizing Provider  acetaminophen (TYLENOL) 325 MG tablet Take 325 mg by mouth every 4 (four) hours as needed for mild pain.    Yes Historical Provider, MD  alum & mag hydroxide-simeth (MAALOX/MYLANTA) 200-200-20 MG/5ML suspension Take 30 mLs by mouth every 6 (six) hours as needed for indigestion or heartburn.   Yes Historical Provider, MD  aspirin EC 81 MG tablet Take 81 mg by mouth every morning.    Yes Historical Provider, MD  cetirizine (ZYRTEC) 10 MG tablet Take 10 mg by mouth daily.   Yes Historical Provider, MD  Cholecalciferol (VITAMIN D PO) Take 800 Units by mouth every morning.    Yes Historical Provider, MD  docusate sodium (COLACE) 100 MG capsule Take 100 mg by mouth 2 (two) times daily.   Yes Historical Provider, MD  levothyroxine (SYNTHROID, LEVOTHROID) 100 MCG tablet Take 100 mcg by mouth every morning.    Yes Historical Provider, MD  lisinopril (PRINIVIL,ZESTRIL) 20 MG tablet Take 20 mg by  mouth every morning.    Yes Historical Provider, MD  lovastatin (MEVACOR) 20 MG tablet Take 20 mg by mouth daily.   Yes Historical Provider, MD  Nutritional Supplements (NUTRITIONAL DRINK PO) Take 1 Dose by mouth 3 (three) times daily.   Yes Historical Provider, MD  polyethylene glycol (MIRALAX / GLYCOLAX) packet Take 17 g by mouth daily as needed for mild constipation.   Yes Historical Provider, MD  PSYLLIUM HUSK PO Take 1.04 g by mouth at bedtime.    Yes Historical Provider, MD  senna (SENOKOT) 8.6 MG tablet Take 1 tablet by mouth daily.   Yes Historical Provider, MD  timolol (BETIMOL) 0.25 % ophthalmic solution Place 1 drop into the right eye 2 (two) times daily.    Yes Historical  Provider, MD  UNABLE TO FIND Take 1 Dose by mouth 3 (three) times daily. Magic Cup   Yes Historical Provider, MD   Dg Hip Unilat With Pelvis 2-3 Views Right  Result Date: 02/28/2016 CLINICAL DATA:  Right hip pain EXAM: DG HIP (WITH OR WITHOUT PELVIS) 2-3V RIGHT COMPARISON:  None. FINDINGS: Severe osteopenia. Right femoral neck fracture with 2.8 cm of superior displacement. No hip dislocation. No other fracture or dislocation. IMPRESSION: Acute right femoral neck fracture with 2.8 cm of superior displacement. Electronically Signed   By: Kathreen Devoid   On: 02/28/2016 12:04    All pertinent xrays, MRI, CT independently reviewed and interpreted  Positive ROS: All other systems have been reviewed and were otherwise negative with the exception of those mentioned in the HPI and as above.  Physical Exam: General: Alert, no acute distress Cardiovascular: No pedal edema Respiratory: No cyanosis, no use of accessory musculature GI: No organomegaly, abdomen is soft and non-tender Skin: No lesions in the area of chief complaint Neurologic: Sensation intact distally Psychiatric: Patient is competent for consent with normal mood and affect Lymphatic: No axillary or cervical lymphadenopathy  MUSCULOSKELETAL:  - pain with movement of the hip and extremity - skin intact - NVI distally - compartments soft  Assessment: Right femoral neck hip fracture  Plan: - hip replacement is recommended, patient and family are aware of r/b/a and wish to proceed - consent obtained - medical optimization per primary team - surgery is planned [facility] Sunday [facility] - Based on history and fracture pattern this likely represents a fragility fracture. - Fragility fractures affect up to one half of women and one third of men after age 75 years and occur in the setting of bone disorder such as osteoporosis or osteopenia and warrant appropriate work-up. - The following are general recommendations that may serve as  an outline for an appropriate work-up:  1.) Obtain bone density measurement to confirm presumptive diagnosis, assess severity of osteoporosis and risk of future fracture, and use as baseline for monitoring treatment  2.) Obtain laboratory tests: CBC, ESR, serum calcium, creatinine, albumin,phosphate, alkaline phosphatase, liver transaminases, protein electrophoresis, urinalysis, 25-hydroxyvitamin D.  3.) Exclude secondary causes of low bone mass and skeletal fragility (eg,multiple myeloma, lymphoma) as indicated.  4.) Obtain radiograph of thoracic and lumbar spine, particularly among individuals with back pain or height loss to assess presence of vertebral fractures  5.) Intermittent administration of recombinant human parathyroid hormone  6.) Optimize nutritional status using nutritional supplementation.  7.) Patient/family education to prevent future falls.  8.) Early mobilization and exercise program - exercise decreases the rate of bone loss and has been associated with decreased rate of fragility fractures   Thank you  for the consult and the opportunity to see Linda Davila. Eduard Roux, MD [place] 8:51 PM

## 2016-02-28 NOTE — H&P (Signed)
History and Physical    Linda Davila [id number] DOB: 1930-12-22 DOA: 02/28/2016  PCP: Viviana Simpler, MD   Patient coming from: Nursing home.   Chief Complaint: Left hip pain.   HPI: Linda Davila is a 83 y.o. female with medical history significant of normal pressure hydrocephalus and CVA with left sided weakness, not ambulatory, uses wheelchair for mobilization. Per nursing home report, patient fall on 02/07/2016, since then she has developed pain on the right hip, sharp in nature, severe in intensity, worse with movement but improved with immobility, no associated symptoms. On clinical evaluation she has noted to be in persistent pain, that prompted imaging with X rays (02/27/16), reported as acute displaced right femoral subcapital fracture.  Transferred to the hospital for pain control and orthopedic surgery evaluation.     Patient on mechanical soft diet.   ED Course: Diagnosed with hip fracture at the NH.   Review of Systems:  1. General. No fever or chills.  2. ENT. No runny nose or sore throat 3. Pulmonary. No sob, cough or hemoptysis 3. Cardiovascular. No chest pain, angina or claudication 4. Gastroenterology. No nausea or vomiting 5. Dermatology no rashes 6. Urology no dysuria or increased urinary frequency 7. Skeletal right hip pain as mentioned in history of present illness 8. Hematology no easy bruisability or frequent infections 9. Neurology, no seizures or paresthesias. Positive for ambulatory dysfunction 10. Endocrine. No tremors, heat or cold intolerance  Past Medical History:  Diagnosis Date  . Cancer ([facility])    skin/ on back  . Heart murmur   . Hypercholesteremia   . Hypertension   . Hypothyroidism    takes Synthroid  . PONV (postoperative nausea and vomiting)    N/V after Breast surgery  . Thyroid disease   . Vertigo     Past Surgical History:  Procedure Laterality Date  . ABDOMINAL HYSTERECTOMY    . APPENDECTOMY    . Bilateral veins  stripping 1968    . CHOLECYSTECTOMY    . EYE SURGERY     cataracts removed both eyes  . HEMORROIDECTOMY    . MASTECTOMY    . VENTRICULOPERITONEAL SHUNT  05/21/2011   Procedure: SHUNT INSERTION VENTRICULAR-PERITONEAL;  Surgeon: Erline Levine, MD;  Location: [facility];  Service: Neurosurgery;  Laterality: N/A;  laparoscopic Ventricular Peritoneal shunt placement with Dr Zella Richer     reports that she has never smoked. She does not have any smokeless tobacco history on file. She reports that she does not drink alcohol or use drugs.  No Known Allergies  Family History  Problem Relation Age of Onset  . Heart disease Mother   . Stroke Father   . Cancer Daughter     breast  . Anesthesia problems Neg Hx   . Hypotension Neg Hx   . Malignant hyperthermia Neg Hx   . Pseudochol deficiency Neg Hx    Unacceptable: Noncontributory, unremarkable, or negative. Acceptable: Family history reviewed and not pertinent (If you reviewed it)  Prior to Admission medications   Medication Sig Start Date End Date Taking? Authorizing Provider  acetaminophen (TYLENOL) 500 MG tablet Take 500 mg by mouth every 4 (four) hours as needed for mild pain.    Historical Provider, MD  alum & mag hydroxide-simeth (MAALOX/MYLANTA) 200-200-20 MG/5ML suspension Take 30 mLs by mouth every 6 (six) hours as needed for indigestion or heartburn.    Historical Provider, MD  aspirin EC 81 MG tablet Take 81 mg by mouth every morning.  Historical Provider, MD  bimatoprost (LUMIGAN) 0.03 % ophthalmic solution Place 1 drop into both eyes at bedtime.    Historical Provider, MD  cholecalciferol (VITAMIN D) 1000 UNITS tablet Take 1,000 Units by mouth every morning.    Historical Provider, MD  clotrimazole (LOTRIMIN) 1 % cream Apply 1 application topically 2 (two) times daily.    Historical Provider, MD  guaifenesin (ROBITUSSIN) 100 MG/5ML syrup Take 200 mg by mouth 4 (four) times daily as needed for cough.    Historical Provider, MD    levothyroxine (SYNTHROID, LEVOTHROID) 75 MCG tablet Take 75 mcg by mouth every morning.     Historical Provider, MD  lisinopril (PRINIVIL,ZESTRIL) 20 MG tablet Take 20 mg by mouth every morning.     Historical Provider, MD  loperamide (IMODIUM) 2 MG capsule Take 2 mg by mouth as needed for diarrhea or loose stools.    Historical Provider, MD  lovastatin (MEVACOR) 20 MG tablet Take 20 mg by mouth at bedtime.    Historical Provider, MD  magnesium hydroxide (MILK OF MAGNESIA) 400 MG/5ML suspension Take 30 mLs by mouth daily as needed for mild constipation.    Historical Provider, MD  miconazole (MICOTIN) 2 % cream Apply 1 application topically 2 (two) times daily as needed (redness/irritation).    Historical Provider, MD  polyethylene glycol (MIRALAX / GLYCOLAX) packet Take 17 g by mouth daily as needed for mild constipation.    Historical Provider, MD  polyvinyl alcohol (LIQUIFILM TEARS) 1.4 % ophthalmic solution Place 1 drop into both eyes 3 (three) times daily as needed for dry eyes.    Historical Provider, MD  timolol (BETIMOL) 0.25 % ophthalmic solution Place 1 drop into the right eye 2 (two) times daily.     Historical Provider, MD    Physical Exam: There were no vitals filed for this visit.    Constitutional: NAD, deconditioned, not in pain or dyspnea.  Head normocephalic,. Nose and ears no deformities.  There were no vitals filed for this visit. Eyes: PERRL, lids and conjunctivae is pale, no icterus.  ENMT: Mucous membranes are dry. Posterior pharynx clear of any exudate or lesions.Normal dentition.  Neck: normal, supple, no masses, no thyromegaly Respiratory: decreased breath sounds bilaterally at bases, no wheezing, no crackles. Normal respiratory effort. No accessory muscle use.  Cardiovascular: Regular rate and rhythm, no murmurs / rubs / gallops. No extremity edema. 2+ pedal pulses. No carotid bruits.  Abdomen: no tenderness, no masses palpated. No hepatosplenomegaly. Bowel sounds  positive.  Musculoskeletal: no clubbing / cyanosis. Left upper extremity with contracture at the elbow and wrist. Lower extremities flexed at the knee, decreased mobility, unable to assess deformities. Pain on palpation on the right hip.  Skin: no rashes, lesions, ulcers. No induration Neurologic: CN 2-12 grossly intact. Sensation intact, DTR normal. Strength 5/5 in all 4.    Labs on Admission: I have personally reviewed following labs and imaging studies  CBC: No results for input(s): WBC, NEUTROABS, HGB, HCT, MCV, PLT in the last 168 hours. Basic Metabolic Panel: No results for input(s): NA, K, CL, CO2, GLUCOSE, BUN, CREATININE, CALCIUM, MG, PHOS in the last 168 hours. GFR: CrCl cannot be calculated (Patient's most recent lab result is older than the maximum 21 days allowed.). Liver Function Tests: No results for input(s): AST, ALT, ALKPHOS, BILITOT, PROT, ALBUMIN in the last 168 hours. No results for input(s): LIPASE, AMYLASE in the last 168 hours. No results for input(s): AMMONIA in the last 168 hours. Coagulation Profile: No results  for input(s): INR, PROTIME in the last 168 hours. Cardiac Enzymes: No results for input(s): CKTOTAL, CKMB, CKMBINDEX, TROPONINI in the last 168 hours. BNP (last 3 results) No results for input(s): PROBNP in the last 8760 hours. HbA1C: No results for input(s): HGBA1C in the last 72 hours. CBG: No results for input(s): GLUCAP in the last 168 hours. Lipid Profile: No results for input(s): CHOL, HDL, LDLCALC, TRIG, CHOLHDL, LDLDIRECT in the last 72 hours. Thyroid Function Tests: No results for input(s): TSH, T4TOTAL, FREET4, T3FREE, THYROIDAB in the last 72 hours. Anemia Panel: No results for input(s): VITAMINB12, FOLATE, FERRITIN, TIBC, IRON, RETICCTPCT in the last 72 hours. Urine analysis:    Component Value Date/Time   COLORURINE Yellow 08/25/2013 0945   COLORURINE YELLOW 04/24/2011 1433   APPEARANCEUR Hazy 08/25/2013 0945   LABSPEC 1.009  08/25/2013 0945   PHURINE 7.0 08/25/2013 0945   PHURINE 6.0 04/24/2011 1433   GLUCOSEU Negative 08/25/2013 0945   HGBUR Negative 08/25/2013 0945   HGBUR NEGATIVE 04/24/2011 1433   BILIRUBINUR Negative 08/25/2013 0945   KETONESUR Negative 08/25/2013 0945   KETONESUR NEGATIVE 04/24/2011 1433   PROTEINUR Negative 08/25/2013 0945   PROTEINUR NEGATIVE 04/24/2011 1433   UROBILINOGEN 0.2 04/24/2011 1433   NITRITE Negative 08/25/2013 0945   NITRITE NEGATIVE 04/24/2011 1433   LEUKOCYTESUR Negative 08/25/2013 0945   Sepsis Labs: !!!!!!!!!!!!!!!!!!!!!!!!!!!!!!!!!!!!!!!!!!!! @LABRCNTIP (procalcitonin:4,lacticidven:4) )No results found for this or any previous visit (from the past 240 hour(s)).   Radiological Exams on Admission: No results found.  EKG: Independently reviewed. NA  Assessment/Plan Active Problems:   Closed left hip fracture [facility])   This is a 83 year old female who has significant ambulatory dysfunction, wheelchair-bound, she does have normal pressure hydrocephalus and a past CVA with left-sided weakness. Apparently sustain a fall 02/07/2016, due to persistent pain further evaluation resulted in diagnosis of acute displaced right hip fracture. On the initial physical examination, patient looks dehydrated, not in pain. Positive tenderness to palpation on the right hip. 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   Working diagnosis. Acute right displaced hip fracture.  1. Acute right displaced hip fracture. Radiology report with right femoral acute subcapital displaced fracture, no images available. Will reorder hip films and will consult orthopedics for further evaluation. Will continue pain control with IV morphine and will recommend strict bedrest for now. Patient has been nothing by mouth since midnight, will add dextrose on IV fluids, if no surgery planned for today will advance her diet. DVT  prophylaxis.  2. Hypothyroid. Continue levothyroxine 75 mcg daily.  3. Dyslipidemia. Continue lovastatin 20 g daily  3. Hypertension. Continue lisinopril 20 mg daily. Will check baseline EKG.  4. Normal pressure hydrocephalus/ CVA. Will continue neuro checks per unit protocol, at this point no signs of agitation, physical therapy once safe per orthopedics.    DVT prophylaxis: enoxaparin Code Status: DNR Family Communication: No family at the bedside  Disposition Plan: SNF Consults called: Orthopedics  Admission status: Inpatient    Kinser Fellman Gerome Apley MD [facility] Pager [phone]  If 7PM-7AM, please contact night-coverage [email or web address] Password [email or web address]  02/28/2016, 10:16 AM

## 2016-02-29 ENCOUNTER — Inpatient Hospital Stay (HOSPITAL_COMMUNITY): Payer: Medicare Other

## 2016-02-29 ENCOUNTER — Encounter (HOSPITAL_COMMUNITY)
Admission: AD | Disposition: A | Discharge status: Skilled Nursing Facility | Payer: Self-pay | Source: Ambulatory Visit | Attending: Internal Medicine

## 2016-02-29 ENCOUNTER — Inpatient Hospital Stay (HOSPITAL_COMMUNITY): Payer: Medicare Other | Admitting: Registered Nurse

## 2016-02-29 DIAGNOSIS — S72001A Fracture of unspecified part of neck of right femur, initial encounter for closed fracture: Secondary | ICD-10-CM

## 2016-02-29 HISTORY — PX: ANTERIOR APPROACH HEMI HIP ARTHROPLASTY: SHX6690

## 2016-02-29 LAB — COMPREHENSIVE METABOLIC PANEL
ALT: 16 U/L (ref 14–54)
ANION GAP: 6 (ref 5–15)
AST: 18 U/L (ref 15–41)
Albumin: 2.7 g/dL — ABNORMAL LOW (ref 3.5–5.0)
Alkaline Phosphatase: 39 U/L (ref 38–126)
BILIRUBIN TOTAL: 0.7 mg/dL (ref 0.3–1.2)
BUN: 15 mg/dL (ref 6–20)
CHLORIDE: 106 mmol/L (ref 101–111)
CO2: 29 mmol/L (ref 22–32)
Calcium: 8 mg/dL — ABNORMAL LOW (ref 8.9–10.3)
Creatinine, Ser: 0.55 mg/dL (ref 0.44–1.00)
Glucose, Bld: 111 mg/dL — ABNORMAL HIGH (ref 65–99)
POTASSIUM: 3.2 mmol/L — AB (ref 3.5–5.1)
Sodium: 141 mmol/L (ref 135–145)
TOTAL PROTEIN: 5.5 g/dL — AB (ref 6.5–8.1)

## 2016-02-29 LAB — CBC
HEMATOCRIT: 35.1 % — AB (ref 36.0–46.0)
HEMOGLOBIN: 11.5 g/dL — AB (ref 12.0–15.0)
MCH: 30.3 pg (ref 26.0–34.0)
MCHC: 32.8 g/dL (ref 30.0–36.0)
MCV: 92.6 fL (ref 78.0–100.0)
Platelets: 354 10*3/uL (ref 150–400)
RBC: 3.79 MIL/uL — AB (ref 3.87–5.11)
RDW: 13.5 % (ref 11.5–15.5)
WBC: 8.6 10*3/uL (ref 4.0–10.5)

## 2016-02-29 LAB — TYPE AND SCREEN
ABO/RH(D): A POS
Antibody Screen: NEGATIVE

## 2016-02-29 LAB — ABO/RH: ABO/RH(D): A POS

## 2016-02-29 SURGERY — HEMIARTHROPLASTY, HIP, DIRECT ANTERIOR APPROACH, FOR FRACTURE
Anesthesia: General | Site: Hip | Laterality: Right

## 2016-02-29 SURGERY — HEMIARTHROPLASTY, HIP, DIRECT ANTERIOR APPROACH, FOR FRACTURE
Anesthesia: General | Laterality: Left

## 2016-02-29 MED ORDER — CEFAZOLIN SODIUM-DEXTROSE 2-4 GM/100ML-% IV SOLN
INTRAVENOUS | Status: AC
  Filled 2016-02-29: qty 100

## 2016-02-29 MED ORDER — SODIUM CHLORIDE 0.9 % IV SOLN
INTRAVENOUS | Status: DC
  Administered 2016-02-29 – 2016-03-01 (×5): via INTRAVENOUS

## 2016-02-29 MED ORDER — FENTANYL CITRATE (PF) 100 MCG/2ML IJ SOLN
INTRAMUSCULAR | Status: DC | PRN
  Administered 2016-02-29 (×2): 50 ug via INTRAVENOUS

## 2016-02-29 MED ORDER — SUGAMMADEX SODIUM 200 MG/2ML IV SOLN
INTRAVENOUS | Status: DC | PRN
  Administered 2016-02-29: 150 mg via INTRAVENOUS

## 2016-02-29 MED ORDER — CEFAZOLIN SODIUM-DEXTROSE 2-4 GM/100ML-% IV SOLN
2.0000 g | INTRAVENOUS | Status: AC
  Administered 2016-02-29: 2 g via INTRAVENOUS
  Filled 2016-02-29: qty 100

## 2016-02-29 MED ORDER — TRANEXAMIC ACID 1000 MG/10ML IV SOLN
1000.0000 mg | INTRAVENOUS | Status: AC
  Administered 2016-02-29: 1000 mg via INTRAVENOUS
  Filled 2016-02-29: qty 10

## 2016-02-29 MED ORDER — ROCURONIUM BROMIDE 50 MG/5ML IV SOSY
PREFILLED_SYRINGE | INTRAVENOUS | Status: AC
  Filled 2016-02-29: qty 10

## 2016-02-29 MED ORDER — DEXTROSE 5 % IV SOLN
INTRAVENOUS | Status: DC | PRN
  Administered 2016-02-29: 30 ug/min via INTRAVENOUS

## 2016-02-29 MED ORDER — LIDOCAINE HCL (CARDIAC) 20 MG/ML IV SOLN
INTRAVENOUS | Status: DC | PRN
  Administered 2016-02-29: 60 mg via INTRAVENOUS

## 2016-02-29 MED ORDER — MENTHOL 3 MG MT LOZG: 1.0000 | LOZENGE | OROMUCOSAL | Status: DC | PRN

## 2016-02-29 MED ORDER — ONDANSETRON HCL 4 MG/2ML IJ SOLN: 4.0000 mg | Freq: Four times a day (QID) | INTRAMUSCULAR | Status: DC | PRN

## 2016-02-29 MED ORDER — ACETAMINOPHEN 650 MG RE SUPP: 650.0000 mg | Freq: Four times a day (QID) | RECTAL | Status: DC | PRN

## 2016-02-29 MED ORDER — ACETAMINOPHEN 10 MG/ML IV SOLN
INTRAVENOUS | Status: DC | PRN
  Administered 2016-02-29: 1000 mg via INTRAVENOUS

## 2016-02-29 MED ORDER — METHOCARBAMOL 1000 MG/10ML IJ SOLN
500.0000 mg | Freq: Four times a day (QID) | INTRAVENOUS | Status: DC | PRN
  Filled 2016-02-29: qty 5

## 2016-02-29 MED ORDER — ENOXAPARIN SODIUM 40 MG/0.4ML ~~LOC~~ SOLN
40.0000 mg | SUBCUTANEOUS | Status: DC
  Administered 2016-03-01 – 2016-03-02 (×2): 40 mg via SUBCUTANEOUS
  Filled 2016-02-29 (×2): qty 0.4

## 2016-02-29 MED ORDER — FENTANYL CITRATE (PF) 100 MCG/2ML IJ SOLN
INTRAMUSCULAR | Status: AC
  Filled 2016-02-29: qty 2

## 2016-02-29 MED ORDER — HYDROCODONE-ACETAMINOPHEN 5-325 MG PO TABS: 1.0000 | ORAL_TABLET | Freq: Four times a day (QID) | ORAL | 0 refills | Status: AC | PRN

## 2016-02-29 MED ORDER — MORPHINE SULFATE (PF) 2 MG/ML IV SOLN: 0.5000 mg | INTRAVENOUS | Status: DC | PRN

## 2016-02-29 MED ORDER — PROPOFOL 10 MG/ML IV BOLUS
INTRAVENOUS | Status: AC
  Filled 2016-02-29: qty 20

## 2016-02-29 MED ORDER — OXYCODONE HCL 5 MG/5ML PO SOLN: 5.0000 mg | Freq: Once | ORAL | Status: DC | PRN

## 2016-02-29 MED ORDER — ALUM & MAG HYDROXIDE-SIMETH 200-200-20 MG/5ML PO SUSP: 30.0000 mL | ORAL | Status: DC | PRN

## 2016-02-29 MED ORDER — FENTANYL CITRATE (PF) 100 MCG/2ML IJ SOLN: 25.0000 ug | INTRAMUSCULAR | Status: DC | PRN

## 2016-02-29 MED ORDER — POVIDONE-IODINE 10 % EX SWAB
2.0000 "application " | Freq: Once | CUTANEOUS | Status: AC
  Administered 2016-02-29: 2 via TOPICAL

## 2016-02-29 MED ORDER — ONDANSETRON HCL 4 MG/2ML IJ SOLN
INTRAMUSCULAR | Status: DC | PRN
  Administered 2016-02-29: 4 mg via INTRAVENOUS

## 2016-02-29 MED ORDER — OXYCODONE HCL 5 MG PO TABS: 5.0000 mg | ORAL_TABLET | Freq: Once | ORAL | Status: DC | PRN

## 2016-02-29 MED ORDER — ACETAMINOPHEN 10 MG/ML IV SOLN
INTRAVENOUS | Status: AC
  Filled 2016-02-29: qty 100

## 2016-02-29 MED ORDER — ONDANSETRON HCL 4 MG/2ML IJ SOLN
4.0000 mg | Freq: Once | INTRAMUSCULAR | Status: DC | PRN
Start: 2016-02-29 — End: 2016-02-29

## 2016-02-29 MED ORDER — ALUM & MAG HYDROXIDE-SIMETH 200-200-20 MG/5ML PO SUSP: 30.0000 mL | Freq: Four times a day (QID) | ORAL | Status: DC | PRN

## 2016-02-29 MED ORDER — OXYCODONE HCL 5 MG PO TABS: 5.0000 mg | ORAL_TABLET | ORAL | Status: DC | PRN

## 2016-02-29 MED ORDER — METOCLOPRAMIDE HCL 5 MG PO TABS: 5.0000 mg | ORAL_TABLET | Freq: Three times a day (TID) | ORAL | Status: DC | PRN

## 2016-02-29 MED ORDER — DEXAMETHASONE SODIUM PHOSPHATE 10 MG/ML IJ SOLN
INTRAMUSCULAR | Status: DC | PRN
  Administered 2016-02-29: 10 mg via INTRAVENOUS

## 2016-02-29 MED ORDER — LIDOCAINE 2% (20 MG/ML) 5 ML SYRINGE
INTRAMUSCULAR | Status: AC
  Filled 2016-02-29: qty 5

## 2016-02-29 MED ORDER — POTASSIUM CHLORIDE CRYS ER 20 MEQ PO TBCR
40.0000 meq | EXTENDED_RELEASE_TABLET | Freq: Once | ORAL | Status: AC
  Administered 2016-02-29: 40 meq via ORAL
  Filled 2016-02-29: qty 2

## 2016-02-29 MED ORDER — STERILE WATER FOR IRRIGATION IR SOLN
Status: DC | PRN
  Administered 2016-02-29: 1000 mL

## 2016-02-29 MED ORDER — 0.9 % SODIUM CHLORIDE (POUR BTL) OPTIME
TOPICAL | Status: DC | PRN
  Administered 2016-02-29: 1000 mL

## 2016-02-29 MED ORDER — SENNA 8.6 MG PO TABS
1.0000 | ORAL_TABLET | Freq: Every day | ORAL | Status: DC
  Administered 2016-03-01 – 2016-03-02 (×2): 8.6 mg via ORAL
  Filled 2016-02-29 (×2): qty 1

## 2016-02-29 MED ORDER — ISOPROPYL ALCOHOL 70 % SOLN
Status: AC
  Filled 2016-02-29: qty 480

## 2016-02-29 MED ORDER — PHENYLEPHRINE HCL 10 MG/ML IJ SOLN
INTRAMUSCULAR | Status: AC
  Filled 2016-02-29: qty 1

## 2016-02-29 MED ORDER — PROPOFOL 10 MG/ML IV BOLUS
INTRAVENOUS | Status: DC | PRN
  Administered 2016-02-29: 60 mg via INTRAVENOUS

## 2016-02-29 MED ORDER — LIP MEDEX EX OINT
TOPICAL_OINTMENT | CUTANEOUS | Status: AC
  Filled 2016-02-29: qty 7

## 2016-02-29 MED ORDER — ONDANSETRON HCL 4 MG PO TABS: 4.0000 mg | ORAL_TABLET | Freq: Four times a day (QID) | ORAL | Status: DC | PRN

## 2016-02-29 MED ORDER — DOCUSATE SODIUM 100 MG PO CAPS
100.0000 mg | ORAL_CAPSULE | Freq: Two times a day (BID) | ORAL | Status: DC
  Administered 2016-03-02: 10:00:00 100 mg via ORAL
  Filled 2016-02-29 (×2): qty 1

## 2016-02-29 MED ORDER — METHOCARBAMOL 500 MG PO TABS: 500.0000 mg | ORAL_TABLET | Freq: Four times a day (QID) | ORAL | Status: DC | PRN

## 2016-02-29 MED ORDER — ACETAMINOPHEN 325 MG PO TABS
650.0000 mg | ORAL_TABLET | Freq: Four times a day (QID) | ORAL | Status: DC | PRN
  Administered 2016-03-01: 650 mg via ORAL
  Filled 2016-02-29: qty 2

## 2016-02-29 MED ORDER — LACTATED RINGERS IV SOLN
INTRAVENOUS | Status: DC | PRN
  Administered 2016-02-29 (×2): via INTRAVENOUS

## 2016-02-29 MED ORDER — CEFAZOLIN SODIUM-DEXTROSE 2-4 GM/100ML-% IV SOLN
2.0000 g | Freq: Four times a day (QID) | INTRAVENOUS | Status: AC
  Administered 2016-02-29 – 2016-03-01 (×3): 2 g via INTRAVENOUS
  Filled 2016-02-29 (×3): qty 100

## 2016-02-29 MED ORDER — ENOXAPARIN SODIUM 40 MG/0.4ML ~~LOC~~ SOLN: 40.0000 mg | Freq: Every day | SUBCUTANEOUS | 0 refills | Status: AC

## 2016-02-29 MED ORDER — METOCLOPRAMIDE HCL 5 MG/ML IJ SOLN: 5.0000 mg | Freq: Three times a day (TID) | INTRAMUSCULAR | Status: DC | PRN

## 2016-02-29 MED ORDER — PHENOL 1.4 % MT LIQD: 1.0000 | OROMUCOSAL | Status: DC | PRN

## 2016-02-29 MED ORDER — SODIUM CHLORIDE 0.9 % IV SOLN
INTRAVENOUS | Status: DC | PRN
  Administered 2016-02-29: 07:00:00 via INTRAVENOUS

## 2016-02-29 MED ORDER — ROCURONIUM BROMIDE 100 MG/10ML IV SOLN
INTRAVENOUS | Status: DC | PRN
  Administered 2016-02-29: 25 mg via INTRAVENOUS
  Administered 2016-02-29: 40 mg via INTRAVENOUS

## 2016-02-29 MED ORDER — HYDROCODONE-ACETAMINOPHEN 5-325 MG PO TABS
1.0000 | ORAL_TABLET | Freq: Four times a day (QID) | ORAL | Status: DC | PRN
  Administered 2016-03-02: 1 via ORAL
  Filled 2016-02-29: qty 1

## 2016-02-29 SURGICAL SUPPLY — 37 items
BAG ZIPLOCK 12X15 (MISCELLANEOUS) ×3 IMPLANT
BLADE SAW SGTL 18X1.27X75 (BLADE) ×2 IMPLANT
BLADE SAW SGTL 18X1.27X75MM (BLADE) ×1
CAPT HIP HEMI 2 ×3 IMPLANT
CELLS DAT CNTRL 66122 CELL SVR (MISCELLANEOUS) ×1 IMPLANT
COVER PERINEAL POST (MISCELLANEOUS) ×3 IMPLANT
DERMABOND ADVANCED (GAUZE/BANDAGES/DRESSINGS) ×2
DERMABOND ADVANCED .7 DNX12 (GAUZE/BANDAGES/DRESSINGS) ×1 IMPLANT
DRAPE C-ARM 42X120 X-RAY (DRAPES) ×3 IMPLANT
DRAPE STERI IOBAN 125X83 (DRAPES) ×3 IMPLANT
DRAPE U-SHAPE 47X51 STRL (DRAPES) ×9 IMPLANT
DRSG AQUACEL AG ADV 3.5X10 (GAUZE/BANDAGES/DRESSINGS) ×3 IMPLANT
DRSG MEPILEX BORDER 4X8 (GAUZE/BANDAGES/DRESSINGS) ×3 IMPLANT
DURAPREP 26ML APPLICATOR (WOUND CARE) ×3 IMPLANT
ELECT BLADE TIP CTD 4 INCH (ELECTRODE) ×3 IMPLANT
ELECT REM PT RETURN 9FT ADLT (ELECTROSURGICAL) ×3
ELECTRODE REM PT RTRN 9FT ADLT (ELECTROSURGICAL) ×1 IMPLANT
FACESHIELD WRAPAROUND (MASK) ×12 IMPLANT
GLOVE SURG SS PI 7.5 STRL IVOR (GLOVE) ×6 IMPLANT
GOWN STRL REUS W/TWL XL LVL3 (GOWN DISPOSABLE) ×6 IMPLANT
HANDPIECE INTERPULSE COAX TIP (DISPOSABLE) ×2
HOOD PEEL AWAY FLYTE STAYCOOL (MISCELLANEOUS) ×3 IMPLANT
KIT BASIN OR (CUSTOM PROCEDURE TRAY) ×3 IMPLANT
PADDING CAST COTTON 6X4 STRL (CAST SUPPLIES) ×6 IMPLANT
RTRCTR WOUND ALEXIS 18CM MED (MISCELLANEOUS) ×3
SET HNDPC FAN SPRY TIP SCT (DISPOSABLE) ×1 IMPLANT
SOL PREP POV-IOD 4OZ 10% (MISCELLANEOUS) ×3 IMPLANT
SUT ETHIBOND #5 BRAIDED 30INL (SUTURE) ×3 IMPLANT
SUT ETHIBOND 2 OS 4 DA (SUTURE) ×9 IMPLANT
SUT ETHILON 3 0 PS 1 (SUTURE) ×3 IMPLANT
SUT PDS AB 1 CT1 27 (SUTURE) ×3 IMPLANT
SUT VIC AB 0 CT1 36 (SUTURE) ×3 IMPLANT
SUT VIC AB 2-0 CT1 27 (SUTURE) ×2
SUT VIC AB 2-0 CT1 TAPERPNT 27 (SUTURE) ×1 IMPLANT
TOWEL OR 17X26 10 PK STRL BLUE (TOWEL DISPOSABLE) ×3 IMPLANT
TOWEL OR NON WOVEN STRL DISP B (DISPOSABLE) ×3 IMPLANT
TRAY FOLEY W/METER SILVER 16FR (SET/KITS/TRAYS/PACK) ×3 IMPLANT

## 2016-02-29 NOTE — Transfer of Care (Signed)
Immediate Anesthesia Transfer of Care Note  Patient: Linda Davila  Procedure(s) Performed: Procedure(s): ANTERIOR APPROACH HEMI HIP ARTHROPLASTY (Right)  Patient Location: PACU  Anesthesia Type:General  Level of Consciousness: awake, sedated and patient cooperative  Airway & Oxygen Therapy: Patient Spontanous Breathing and Patient connected to face mask oxygen  Post-op Assessment: Report given to RN, Post -op Vital signs reviewed and stable and Patient moving all extremities X 4  Post vital signs: stable  Last Vitals:  Vitals:   02/29/16 0941 02/29/16 0945  BP: 134/69 130/69  Pulse: 82 81  Resp:  17  Temp:  36.6 C    Last Pain:  Vitals:   02/29/16 0535  TempSrc: Axillary  PainSc:          Complications: No apparent anesthesia complications

## 2016-02-29 NOTE — Care Management Note (Signed)
Case Management Note  Patient Details  Name: Linda Davila MRN: [id number] Date of Birth: May 21, 1930  Subjective/Objective:       Closed right hip fracture             Action/Plan: Discharge Planning: Chart reviewed. Pt had fall at SNF. Waiting PT recommendations. CSW referral for SNF placement.    Expected Discharge Date:                  Expected Discharge Plan:  Skilled Nursing Facility  In-House Referral:  Clinical Social Work  Discharge planning Services  CM Consult  Post Acute Care Choice:  NA Choice offered to:  NA  DME Arranged:  N/A DME Agency:  NA  HH Arranged:  NA HH Agency:  NA  Status of Service:  In process, will continue to follow  If discussed at Long Length of Stay Meetings, dates discussed:    Additional Comments:  Erenest Rasher, RN 02/29/2016, 5:05 PM

## 2016-02-29 NOTE — Op Note (Addendum)
ANTERIOR APPROACH HEMI HIP ARTHROPLASTY  Procedure Note Linda Davila   [id number]  Pre-op Diagnosis: right hip fracture     Post-op Diagnosis: same   Operative Procedures  1. Prosthetic replacement for femoral neck fracture. CPT [phone]  Personnel  Surgeon(s): Naiping Ephriam Jenkins, MD Mcarthur Rossetti, MD, necessary for the timely completion of the surgery   Anesthesia: general  Prosthesis: Depuy Femur: Corail KLA 15 Head: 45 mm size: +1.5 Bearing Type: Bipolar  Date of Service: 02/28/2016 - 02/29/2016  Hip Hemiarthroplasty (Anterior Approach) Op Note:  After informed consent was obtained and the operative extremity marked in the holding area, the patient was brought back to the operating room and placed supine on the HANA table. Next, the operative extremity was prepped and draped in normal sterile fashion. Surgical timeout occurred verifying patient identification, surgical site, surgical procedure and administration of antibiotics.  A modified anterior Smith-Peterson approach to the hip was performed, using the interval between tensor fascia lata and sartorius.  Dissection was carried bluntly down onto the anterior hip capsule. The lateral femoral circumflex vessels were identified and coagulated. A capsulotomy was performed and the capsular flaps tagged for later repair.  Fluoroscopy was utilized to prepare for the femoral neck cut. The neck osteotomy was performed. The femoral head was removed and found a 45 mm head was the appropriate fit.    We then turned our attention to the femur.  After placing the femoral hook, the leg was taken to externally rotated, extended and adducted position taking care to perform soft tissue releases to allow for adequate mobilization of the femur. Soft tissue was cleared from the shoulder of the greater trochanter and the hook elevator used to improve exposure of the proximal femur. Sequential broaching performed up to a size 15. Trial neck and head  were placed. The leg was brought back up to neutral and the construct reduced. The position and sizing of components, offset and leg lengths were checked using fluoroscopy. Stability of the construct was checked in extension and external rotation without any subluxation or impingement of prosthesis. We dislocated the prosthesis, dropped the leg back into position, removed trial components, and irrigated copiously. The final stem and head was then placed, the leg brought back up, the system reduced and fluoroscopy used to verify positioning.  We irrigated, obtained hemostasis and closed the capsule using #2 ethibond suture.  The fascia was closed with #1 vicryl plus, the deep fat layer was closed with 0 vicryl, the subcutaneous layers closed with 2.0 Vicryl Plus and the skin closed with staples. A sterile dressing was applied. The patient was awakened in the operating room and taken to recovery in stable condition. All sponge, needle, and instrument counts were correct at the end of the case.   Position: supine  Complications: none.  Time Out: performed   Drains/Packing: none  Estimated blood loss: 100 cc  Returned to Recovery Room: in good condition.   Antibiotics: yes   Mechanical VTE (DVT) Prophylaxis: sequential compression devices, TED thigh-high  Chemical VTE (DVT) Prophylaxis: lovenox  Fluid Replacement: Crystalloid: see anesthesia record  Specimens Removed: 1 to pathology   Sponge and Instrument Count Correct? yes   PACU: portable radiograph - low AP   Admission: inpatient status, start PT & OT POD#1  Plan/RTC: Return in 2 weeks for staple removal. Return in 6 weeks to see MD.  Weight Bearing/Load Lower Extremity: full  Hip precautions: none Suture Removal: 10-14 days  Betadine to incision  twice daily once dressing is removed on POD#7  N. Eduard Roux, MD [place] 9:21 AM     Implant Name Type Inv. Item Serial No. Manufacturer Lot No. LRB No.  Used  HIP BALL ARTICU DEPUY - [id number] Hips HIP BALL ARTICU DEPUY  DEPUY SYNTHES [id number] Right 1  BIPOLAR PROS AML 45MM - [id number] Hips BIPOLAR PROS AML 45MM  DEPUY SYNTHES [id number] Right 1  STEM CORAIL KLA15 - [id number] Stem STEM CORAIL KLA15   DEPUY SYNTHES [id number] Right 1

## 2016-02-29 NOTE — Anesthesia Postprocedure Evaluation (Addendum)
Anesthesia Post Note  Patient: Linda Davila  Procedure(s) Performed: Procedure(s) (LRB): ANTERIOR APPROACH HEMI HIP ARTHROPLASTY (Right)  Patient location during evaluation: PACU Anesthesia Type: General Level of consciousness: awake, lethargic and confused Pain management: pain level controlled Vital Signs Assessment: post-procedure vital signs reviewed and stable Respiratory status: nonlabored ventilation, spontaneous breathing and respiratory function stable Cardiovascular status: blood pressure returned to baseline Anesthetic complications: no       Last Vitals:  Vitals:   02/29/16 1158 02/29/16 1312  BP: (!) 111/54 (!) 142/67  Pulse: 83 83  Resp: 16 18  Temp: 36.6 C 36.4 C    Last Pain:  Vitals:   02/29/16 1312  TempSrc: Axillary  PainSc:                  [name]

## 2016-02-29 NOTE — Anesthesia Preprocedure Evaluation (Addendum)
Anesthesia Evaluation  Patient identified by MRN, date of birth, ID band Patient awake    Reviewed: Allergy & Precautions, NPO status , Patient's Chart, lab work & pertinent test results  Airway Mallampati: II  TM Distance: >3 FB     Dental  (+) Teeth Intact   Pulmonary     + decreased breath sounds      Cardiovascular hypertension,  Rhythm:Regular Rate:Normal     Neuro/Psych    GI/Hepatic   Endo/Other    Renal/GU      Musculoskeletal   Abdominal   Peds  Hematology   Anesthesia Other Findings Patient unresponsive to questions, minimally responsive to commands.   Reproductive/Obstetrics                            Anesthesia Physical Anesthesia Plan  ASA: III  Anesthesia Plan: General   Post-op Pain Management:    Induction: Intravenous  Airway Management Planned: Oral ETT  Additional Equipment:   Intra-op Plan:   Post-operative Plan: Possible Post-op intubation/ventilation  Informed Consent: I have reviewed the patients History and Physical, chart, labs and discussed the procedure including the risks, benefits and alternatives for the proposed anesthesia with the patient or authorized representative who has indicated his/her understanding and acceptance.     Plan Discussed with: CRNA and Anesthesiologist  Anesthesia Plan Comments: (R. subcapital hip fracture Dementia lives in NH, non-communicative Hypertension H/O normal pressure hydrocephalus S/P VP shunt insertion 05/2011.)       Anesthesia Quick Evaluation

## 2016-02-29 NOTE — Anesthesia Procedure Notes (Signed)
Procedure Name: Intubation Date/Time: 02/29/2016 8:04 AM Performed by: Lissa Morales Pre-anesthesia Checklist: Patient identified, Emergency Drugs available, Suction available and Patient being monitored Patient Re-evaluated:Patient Re-evaluated prior to inductionOxygen Delivery Method: Circle system utilized Preoxygenation: Pre-oxygenation with 100% oxygen Intubation Type: IV induction Ventilation: Mask ventilation without difficulty Laryngoscope Size: Mac and 4 Grade View: Grade II Tube type: Oral Tube size: 7.5 (taper ETT) mm Number of attempts: 1 Airway Equipment and Method: Stylet and Oral airway Placement Confirmation: ETT inserted through vocal cords under direct vision,  positive ETCO2 and breath sounds checked- equal and bilateral Secured at: 21 cm Tube secured with: Tape Dental Injury: Teeth and Oropharynx as per pre-operative assessment

## 2016-02-29 NOTE — Progress Notes (Signed)
PROGRESS NOTE    Linda Davila  [id number] DOB: 07/26/1930 DOA: 02/28/2016 PCP: Viviana Simpler, MD    Brief Narrative: Linda Davila is a 83 y.o. female with medical history significant of normal pressure hydrocephalus and CVA with left sided weakness, not ambulatory, uses wheelchair for mobilization, had a fall at SNF, came in for left hip pain, x rays show acute displaced right femoral sub capital fracture.   Assessment & Plan:   Active Problems:   Closed hip fracture, right, initial encounter ([place])   Acute right displaced hip fracture: Admitted for surgical repair. Orthopedics consulted.  Underwent hemiarthroplasty on the rigth by Dr Erlinda Hong on 1/21.  Pain control and start PT in am as per orthopedics.    Hypothyroidism: resume synthroid.    Hypertension: well controlled.   Hypokalemia: repleted repeat in am.      DVT prophylaxis: (Lovenox/) Code Status: dnr. ) Family Communication: none at bedside.  Disposition Plan: pending further eval.    Consultants:   Orthopedics Dr Erlinda Hong.    Procedures: right hemi arthroplasty.    Antimicrobials: pre op cefazolin.    Subjective: Appears comfortable.   Objective: Vitals:   02/29/16 1100 02/29/16 1158 02/29/16 1312 02/29/16 1400  BP:  (!) 111/54 (!) 142/67 133/71  Pulse: 83 83 83 85  Resp: 16 16 18 14   Temp: 97.6 F (36.4 C) 97.8 F (36.6 C) 97.6 F (36.4 C) 98.4 F (36.9 C)  TempSrc:  Axillary Axillary Axillary  SpO2: 96% 98% 100% 100%    Intake/Output Summary (Last 24 hours) at 02/29/16 1614 Last data filed at 02/29/16 1435  Gross per 24 hour  Intake          2394.17 ml  Output              465 ml  Net          1929.17 ml   There were no vitals filed for this visit.  Examination:  General exam: Appears calm and comfortable  Respiratory system: Clear to auscultation. Respiratory effort normal. Cardiovascular system: S1 & S2 heard, RRR. No JVD, murmurs, rubs, gallops or clicks. No pedal  edema. Gastrointestinal system: Abdomen is nondistended, soft and nontender. No organomegaly or masses felt. Normal bowel sounds heard. Central nervous system: Alert , comfortable.  Extremities: right lower extremity tender movements. Upper extremity contracture.  Skin: No rashes, lesions or ulcers     Data Reviewed: I have personally reviewed following labs and imaging studies  CBC:  Recent Labs Lab 02/28/16 1246 02/29/16 0434  WBC 9.2 8.6  HGB 12.7 11.5*  HCT 36.7 35.1*  MCV 91.8 92.6  PLT 366 [id number]   Basic Metabolic Panel:  Recent Labs Lab 02/28/16 1246 02/29/16 0434  NA  --  141  K  --  3.2*  CL  --  106  CO2  --  29  GLUCOSE  --  111*  BUN  --  15  CREATININE 0.54 0.55  CALCIUM  --  8.0*   GFR: CrCl cannot be calculated (Unknown ideal weight.). Liver Function Tests:  Recent Labs Lab 02/29/16 0434  AST 18  ALT 16  ALKPHOS 39  BILITOT 0.7  PROT 5.5*  ALBUMIN 2.7*   No results for input(s): LIPASE, AMYLASE in the last 168 hours. No results for input(s): AMMONIA in the last 168 hours. Coagulation Profile: No results for input(s): INR, PROTIME in the last 168 hours. Cardiac Enzymes: No results for input(s): CKTOTAL, CKMB, CKMBINDEX, TROPONINI in the  last 168 hours. BNP (last 3 results) No results for input(s): PROBNP in the last 8760 hours. HbA1C: No results for input(s): HGBA1C in the last 72 hours. CBG: No results for input(s): GLUCAP in the last 168 hours. Lipid Profile: No results for input(s): CHOL, HDL, LDLCALC, TRIG, CHOLHDL, LDLDIRECT in the last 72 hours. Thyroid Function Tests: No results for input(s): TSH, T4TOTAL, FREET4, T3FREE, THYROIDAB in the last 72 hours. Anemia Panel: No results for input(s): VITAMINB12, FOLATE, FERRITIN, TIBC, IRON, RETICCTPCT in the last 72 hours. Sepsis Labs: No results for input(s): PROCALCITON, LATICACIDVEN in the last 168 hours.  Recent Results (from the past 240 hour(s))  Surgical pcr screen     Status:  Abnormal   Collection Time: 02/28/16  4:30 PM  Result Value Ref Range Status   MRSA, PCR NEGATIVE NEGATIVE Final   Staphylococcus aureus POSITIVE (A) NEGATIVE Final    Comment:        The Xpert SA Assay (FDA approved for NASAL specimens in patients over 32 years of age), is one component of a comprehensive surveillance program.  Test performance has been validated by [facility] for patients greater than or equal to 24 year old. It is not intended to diagnose infection nor to guide or monitor treatment.          Radiology Studies: Pelvis Portable  Result Date: 02/29/2016 CLINICAL DATA:  Total hip replacement. EXAM: PORTABLE PELVIS 1-2 VIEWS COMPARISON:  February 28, 2016 FINDINGS: New right hip replacement. Hardware is in good position. Postoperative skin staples and soft tissue air identified. Some sort of tubing overlies the lower pelvis IMPRESSION: Status post right hip replacement. Electronically Signed   By: Dorise Bullion III M.D   On: 02/29/2016 11:43   Dg C-arm 1-60 Min-no Report  Result Date: 02/29/2016 There is no Radiologist interpretation  for this exam.  Dg Hip Operative Unilat W Or W/o Pelvis Right  Result Date: 02/29/2016 CLINICAL DATA:  Right hip replacement EXAM: OPERATIVE RIGHT HIP (WITH PELVIS IF PERFORMED) 3 VIEWS TECHNIQUE: Fluoroscopic spot image(s) were submitted for interpretation post-operatively. COMPARISON:  None FLUOROSCOPY TIME:  15 seconds 1.09 mGy FINDINGS: Interval right hip arthroplasty without failure or complication. No fracture or dislocation. IMPRESSION: Interval right hip arthroplasty. Electronically Signed   By: Kathreen Devoid   On: 02/29/2016 10:34   Dg Hip Unilat With Pelvis 2-3 Views Right  Result Date: 02/28/2016 CLINICAL DATA:  Right hip pain EXAM: DG HIP (WITH OR WITHOUT PELVIS) 2-3V RIGHT COMPARISON:  None. FINDINGS: Severe osteopenia. Right femoral neck fracture with 2.8 cm of superior displacement. No hip dislocation. No other  fracture or dislocation. IMPRESSION: Acute right femoral neck fracture with 2.8 cm of superior displacement. Electronically Signed   By: Kathreen Devoid   On: 02/28/2016 12:04        Scheduled Meds: .  ceFAZolin (ANCEF) IV  2 g Intravenous Q6H  . cholecalciferol  1,000 Units Oral q morning - 10a  . docusate sodium  100 mg Oral BID  . [START ON 03/01/2016] enoxaparin (LOVENOX) injection  40 mg Subcutaneous Q24H  . latanoprost  1 drop Both Eyes QHS  . levothyroxine  100 mcg Oral QAC breakfast  . lip balm      . lisinopril  20 mg Oral q morning - 10a  . pravastatin  20 mg Oral q1800  . senna  1 tablet Oral Daily  . timolol  1 drop Right Eye BID   Continuous Infusions: . sodium chloride 125 mL/hr  at 02/29/16 1215  . dextrose 5 % and 0.9% NaCl 50 mL/hr at 02/28/16 1230     LOS: 1 day    Time spent: 30 minutes.     Hosie Poisson, MD [facility] Pager [phone]  If 7PM-7AM, please contact night-coverage [email or web address] Password [email or web address] 02/29/2016, 4:14 PM

## 2016-02-29 NOTE — Discharge Instructions (Signed)
° ° °  1. Change dressings as needed °2. May shower but keep incisions covered and dry °3. Take lovenox to prevent blood clots °4. Take stool softeners as needed °5. Take pain meds as needed ° °

## 2016-03-01 ENCOUNTER — Encounter (HOSPITAL_COMMUNITY): Payer: Self-pay | Admitting: Orthopaedic Surgery

## 2016-03-01 LAB — CBC
HCT: 29.2 % — ABNORMAL LOW (ref 36.0–46.0)
HEMOGLOBIN: 9.9 g/dL — AB (ref 12.0–15.0)
MCH: 31.5 pg (ref 26.0–34.0)
MCHC: 33.9 g/dL (ref 30.0–36.0)
MCV: 93 fL (ref 78.0–100.0)
Platelets: 298 10*3/uL (ref 150–400)
RBC: 3.14 MIL/uL — AB (ref 3.87–5.11)
RDW: 13.5 % (ref 11.5–15.5)
WBC: 12.3 10*3/uL — ABNORMAL HIGH (ref 4.0–10.5)

## 2016-03-01 LAB — BASIC METABOLIC PANEL
Anion gap: 5 (ref 5–15)
BUN: 15 mg/dL (ref 6–20)
CHLORIDE: 110 mmol/L (ref 101–111)
CO2: 25 mmol/L (ref 22–32)
Calcium: 7.5 mg/dL — ABNORMAL LOW (ref 8.9–10.3)
Creatinine, Ser: 0.52 mg/dL (ref 0.44–1.00)
GFR calc Af Amer: >60 mL/min (ref >60)
GFR calc non Af Amer: >60 mL/min (ref >60)
GLUCOSE: 111 mg/dL — AB (ref 65–99)
Potassium: 3.9 mmol/L (ref 3.5–5.1)
Sodium: 140 mmol/L (ref 135–145)

## 2016-03-01 MED ORDER — CHLORHEXIDINE GLUCONATE CLOTH 2 % EX PADS
6.0000 | MEDICATED_PAD | Freq: Every day | CUTANEOUS | Status: DC
  Administered 2016-03-01 – 2016-03-02 (×2): 6 via TOPICAL

## 2016-03-01 MED ORDER — MUPIROCIN 2 % EX OINT
1.0000 "application " | TOPICAL_OINTMENT | Freq: Two times a day (BID) | CUTANEOUS | Status: DC
  Administered 2016-03-01 – 2016-03-02 (×3): 1 via NASAL
  Filled 2016-03-01: qty 22

## 2016-03-01 NOTE — Progress Notes (Signed)
Infectious disease called nurse. Patient is positive for staph aureus. Nurse entered order set for positive staph aureus per infectious disease.

## 2016-03-01 NOTE — Evaluation (Signed)
Physical Therapy Evaluation Patient Details Name: Linda Davila MRN: [id number] DOB: 05-04-1930 Today's Date: 03/01/2016   History of Present Illness  83 y.o. female admitted wtih fall at SNF, R hip fx, s/p R hip hemiarthroplasty (anterior approach) 02/29/16. PMH of shunt placement for hydrocephalus 2013, h/o CVA with L hemiparesis.  Clinical Impression  Per phone discussion with pt's daughter, pt is dependent for [facility] transfers and for ADLs at baseline. With PT eval today, pt requires total assist for supine to sit, mod A for sitting balance. Performed ROM to RLE. Mechanical lift for transfers with nursing recommended.  PT will sign off as pt is at baseline of dependence for mobility.     Follow Up Recommendations SNF;Supervision/Assistance - 24 hour    Equipment Recommendations  None recommended by PT    Recommendations for Other Services       Precautions / Restrictions Precautions Precautions: Fall Restrictions Weight Bearing Restrictions: No Other Position/Activity Restrictions: wbat RLE      Mobility  Bed Mobility Overal bed mobility: Needs Assistance Bed Mobility: Supine to Sit;Sit to Supine     Supine to sit: Total assist Sit to supine: Total assist   General bed mobility comments: pt 0% for supine to sit, sat on EOB for 2 minutes with mod A for balance  Transfers                 General transfer comment: nursing to use mechanical lift  Ambulation/Gait             General Gait Details: WC bound at baseline  Stairs            Wheelchair Mobility    Modified Rankin (Stroke Patients Only)       Balance Overall balance assessment: Needs assistance Sitting-balance support: Feet unsupported;Bilateral upper extremity supported Sitting balance-Leahy Scale: Poor Sitting balance - Comments: mod A for sitting balance, kyphotic posture                                     Pertinent Vitals/Pain Pain Assessment: Faces Faces Pain  Scale: Hurts little more Pain Location: R hip with movement Pain Descriptors / Indicators: Sore Pain Intervention(s): Limited activity within patient's tolerance;Monitored during session;Premedicated before session;Ice applied    Home Living Family/patient expects to be discharged to:: Skilled nursing facility                 Additional Comments: prior function info provided by pt's daughter by phone, plan is to return to SNF    Prior Function Level of Independence: Needs assistance   Gait / Transfers Assistance Needed: dependent for [facility] transfers (pt's daughter reports SNF staff lift pt into WC, pt doesn't assist, pt doesn't propel WC, and that she's not able to maintain sitting balance unsupported), following shunt placement in 2013 pt was able to walk short distances but has been WC bound since 2016  ADL's / Homemaking Assistance Needed: dependent for ADLs & feeding per pt's daughter        Hand Dominance        Extremity/Trunk Assessment   Upper Extremity Assessment Upper Extremity Assessment: RUE deficits/detail;LUE deficits/detail RUE Deficits / Details: can actively grip +3/5, shoulder elevation to 120* AAROM, -3/5 shoulder elevation LUE Deficits / Details: flexion contracture of fingers    Lower Extremity Assessment Lower Extremity Assessment: LLE deficits/detail;RLE deficits/detail RLE Deficits / Details: ankle -2/5, ankle PROM WFL,  knee ext -20* AAROM, hip/knee -2/5 LLE Deficits / Details: +1/5 ankle PF/DF, no active movement L knee/hip    Cervical / Trunk Assessment Cervical / Trunk Assessment: Kyphotic  Communication   Communication: Receptive difficulties;Expressive difficulties (pt verbalizes with 1-2 word responses, sometimes appropriatly sometimes not, and sometimes no response; pt's daughter reports her speech has been declining recently)  Cognition Arousal/Alertness: Awake/alert Behavior During Therapy: Flat affect Overall Cognitive Status: History  of cognitive impairments - at baseline                      General Comments      Exercises     Assessment/Plan    PT Assessment Patent does not need any further PT services  PT Problem List            PT Treatment Interventions      PT Goals (Current goals can be found in the Care Plan section)  Acute Rehab PT Goals Patient Stated Goal: return to [facility] SNF PT Goal Formulation: With family    Frequency     Barriers to discharge        Co-evaluation               End of Session   Activity Tolerance: Patient tolerated treatment well Patient left: in bed;with call bell/phone within reach;with bed alarm set Nurse Communication: Need for lift equipment;Mobility status         Time: 1035-1055 PT Time Calculation (min) (ACUTE ONLY): 20 min   Charges:   PT Evaluation $PT Eval High Complexity: 1 Procedure     PT G Codes:        Philomena Doheny 03/01/2016, 11:06 AM [phone]

## 2016-03-01 NOTE — Progress Notes (Signed)
OT Cancellation Note  Patient Details Name: Linda Davila MRN: [id number] DOB: [birth year]   Cancelled Treatment:    Reason Eval/Treat Not Completed: Other (comment) Pt is Medicare and current D/C plan is SNF. No apparent immediate acute care OT needs, therefore will defer OT to SNF. If OT eval is needed please call Acute Rehab Dept. at [phone].    Almon Register [id number] 03/01/2016, 1:02 PM

## 2016-03-01 NOTE — NC FL2 (Signed)
[place] LEVEL OF CARE SCREENING TOOL     IDENTIFICATION  Patient Name: Linda Davila Birthdate: [birth year] Sex: female Admission Date (Current Location): 02/28/2016  [facility] and [place] Number:  Herbalist and Address:  [facility],  [place] [address], [place]      Provider Number: [id number]  Attending Physician Name and Address:  Hosie Poisson, MD  Relative Name and Phone Number:       Current Level of Care: Hospital Recommended Level of Care: [place] Prior Approval Number:    Date Approved/Denied:   PASRR Number: [id number]  Discharge Plan: SNF    Current Diagnoses: Patient Active Problem List   Diagnosis Date Noted  . Closed hip fracture, right, initial encounter ([place]) 02/28/2016  . Weakness of both legs 04/24/2011  . NPH (normal pressure hydrocephalus) 04/24/2011  . Urinary incontinence without sensory awareness 04/24/2011  . Gait abnormality 04/24/2011  . Cognitive and behavioral changes 04/24/2011  . Hypertension 04/24/2011  . Hyperlipidemia 04/24/2011  . Glaucoma 04/24/2011    Orientation RESPIRATION BLADDER Height & Weight      (disoriented x 4 - responds to voice / pain)  O2 Indwelling catheter Weight:   Height:     BEHAVIORAL SYMPTOMS/MOOD NEUROLOGICAL BOWEL NUTRITION STATUS  Other (Comment) (no behaviors)   Incontinent Diet  AMBULATORY STATUS COMMUNICATION OF NEEDS Skin   Extensive Assist Does not communicate Surgical wounds, Other (Comment) (stage 2 pressure ulcer on sacrum)                       Personal Care Assistance Level of Assistance  Bathing, Feeding, Dressing Bathing Assistance: Maximum assistance Feeding assistance: Maximum assistance Dressing Assistance: Maximum assistance     Functional Limitations Info  Sight, Hearing, Speech Sight Info: Adequate Hearing Info: Adequate Speech Info: Impaired    SPECIAL CARE FACTORS FREQUENCY  PT (By licensed PT)     PT  Frequency: 5x wk              Contractures Contractures Info: Not present    Additional Factors Info  Code Status Code Status Info: DNR             Current Medications (03/01/2016):  This is the current hospital active medication list Current Facility-Administered Medications  Medication Dose Route Frequency Provider Last Rate Last Dose  . 0.9 %  sodium chloride infusion   Intravenous Continuous Leandrew Koyanagi, MD 125 mL/hr at 03/01/16 [phone]    . acetaminophen (TYLENOL) tablet 650 mg  650 mg Oral Q6H PRN Tawni Millers, MD   650 mg at 02/29/16 2153   Or  . acetaminophen (TYLENOL) suppository 650 mg  650 mg Rectal Q6H PRN Tawni Millers, MD      . acetaminophen (TYLENOL) tablet 650 mg  650 mg Oral Q6H PRN Leandrew Koyanagi, MD   650 mg at 03/01/16 0818   Or  . acetaminophen (TYLENOL) suppository 650 mg  650 mg Rectal Q6H PRN Leandrew Koyanagi, MD      . alum & mag hydroxide-simeth (MAALOX/MYLANTA) 200-200-20 MG/5ML suspension 30 mL  30 mL Oral Q6H PRN Tawni Millers, MD      . alum & mag hydroxide-simeth (MAALOX/MYLANTA) 200-200-20 MG/5ML suspension 30 mL  30 mL Oral Q6H PRN Leandrew Koyanagi, MD      . alum & mag hydroxide-simeth (MAALOX/MYLANTA) 200-200-20 MG/5ML suspension 30 mL  30 mL Oral Q4H PRN Naiping Ephriam Jenkins,  MD      . Chlorhexidine Gluconate Cloth 2 % PADS 6 each  6 each Topical Daily Hosie Poisson, MD      . cholecalciferol (VITAMIN D) tablet 1,000 Units  1,000 Units Oral q morning - 10a Mauricio Gerome Apley, MD   1,000 Units at 03/01/16 [phone]  . docusate sodium (COLACE) capsule 100 mg  100 mg Oral BID Leandrew Koyanagi, MD      . enoxaparin (LOVENOX) injection 40 mg  40 mg Subcutaneous Q24H Naiping Ephriam Jenkins, MD   40 mg at 03/01/16 0827  . guaiFENesin (ROBITUSSIN) 100 MG/5ML solution 200 mg  200 mg Oral QID PRN Tawni Millers, MD      . HYDROcodone-acetaminophen (NORCO/VICODIN) 5-325 MG per tablet 1-2 tablet  1-2 tablet Oral Q6H PRN Naiping Ephriam Jenkins, MD      . latanoprost  (XALATAN) 0.005 % ophthalmic solution 1 drop  1 drop Both Eyes QHS Tawni Millers, MD   1 drop at 02/29/16 2222  . levothyroxine (SYNTHROID, LEVOTHROID) tablet 100 mcg  100 mcg Oral QAC breakfast Tawni Millers, MD   100 mcg at 03/01/16 0819  . lisinopril (PRINIVIL,ZESTRIL) tablet 20 mg  20 mg Oral q morning - 10a Mauricio Gerome Apley, MD   20 mg at 03/01/16 0818  . loperamide (IMODIUM) capsule 2 mg  2 mg Oral PRN Tawni Millers, MD      . magnesium hydroxide (MILK OF MAGNESIA) suspension 30 mL  30 mL Oral Daily PRN Mauricio Gerome Apley, MD      . menthol-cetylpyridinium (CEPACOL) lozenge 3 mg  1 lozenge Oral PRN Naiping Ephriam Jenkins, MD       Or  . phenol (CHLORASEPTIC) mouth spray 1 spray  1 spray Mouth/Throat PRN Naiping Ephriam Jenkins, MD      . methocarbamol (ROBAXIN) tablet 500 mg  500 mg Oral Q6H PRN Naiping Ephriam Jenkins, MD       Or  . methocarbamol (ROBAXIN) 500 mg in dextrose 5 % 50 mL IVPB  500 mg Intravenous Q6H PRN Naiping Ephriam Jenkins, MD      . metoCLOPramide (REGLAN) tablet 5-10 mg  5-10 mg Oral Q8H PRN Naiping Ephriam Jenkins, MD       Or  . metoCLOPramide (REGLAN) injection 5-10 mg  5-10 mg Intravenous Q8H PRN Naiping Ephriam Jenkins, MD      . morphine 2 MG/ML injection 0.5 mg  0.5 mg Intravenous Q2H PRN Naiping Ephriam Jenkins, MD      . morphine 2 MG/ML injection 1 mg  1 mg Intravenous Q4H PRN Mauricio Gerome Apley, MD      . mupirocin ointment (BACTROBAN) 2 % 1 application  1 application Nasal BID Hosie Poisson, MD      . ondansetron (ZOFRAN) tablet 4 mg  4 mg Oral Q6H PRN Mauricio Gerome Apley, MD       Or  . ondansetron [facility]) injection 4 mg  4 mg Intravenous Q6H PRN Mauricio Gerome Apley, MD      . ondansetron [facility]) tablet 4 mg  4 mg Oral Q6H PRN Naiping Ephriam Jenkins, MD       Or  . ondansetron (ZOFRAN) injection 4 mg  4 mg Intravenous Q6H PRN Naiping Ephriam Jenkins, MD      . oxyCODONE (Oxy IR/ROXICODONE) immediate release tablet 5-10 mg  5-10 mg Oral Q4H PRN Naiping Ephriam Jenkins, MD      . polyethylene glycol (MIRALAX /  GLYCOLAX) packet 17 g  17  g Oral Daily PRN Tawni Millers, MD      . polyvinyl alcohol (LIQUIFILM TEARS) 1.4 % ophthalmic solution 1 drop  1 drop Both Eyes TID PRN Tawni Millers, MD      . pravastatin (PRAVACHOL) tablet 20 mg  20 mg Oral q1800 Tawni Millers, MD   20 mg at 02/28/16 1759  . senna (SENOKOT) tablet 8.6 mg  1 tablet Oral Daily Naiping Ephriam Jenkins, MD   8.6 mg at 03/01/16 0818  . timolol (TIMOPTIC) 0.25 % ophthalmic solution 1 drop  1 drop Right Eye BID Tawni Millers, MD   1 drop at 02/29/16 2222     Discharge Medications: Please see discharge summary for a list of discharge medications.  Relevant Imaging Results:  Relevant Lab Results:   Additional Information SS # [id number]  Rainah Kirshner, Randall An, LCSW

## 2016-03-01 NOTE — Clinical Social Work Note (Signed)
Clinical Social Work Assessment  Patient Details  Name: Linda Davila MRN: [id number] Date of Birth: [birth year]  Date of referral:  03/01/16               Reason for consult:  Facility Placement, Discharge Planning                Permission sought to share information with:  Facility Art therapist granted to share information::  Yes, Verbal Permission Granted  Name::        Agency::     Relationship::     Contact Information:     Housing/Transportation Living arrangements for the past 2 months:  [place] of Information:  Adult Children Patient Interpreter Needed:  None Criminal Activity/Legal Involvement Pertinent to Current Situation/Hospitalization:  No - Comment as needed Significant Relationships:  Adult Children Lives with:  Facility Resident Do you feel safe going back to the place where you live?  Yes Need for family participation in patient care:  Yes (Comment)  Care giving concerns:  Daughter will discuss concerns with leadership at [facility] prior to pt's return. Daughter worries that it took too long for pt to get her hip x rayed at facility.   Social Worker assessment / plan:  Pt hospitalized on 02/28/16 from [place] where pt is a LTC resident with a hip fx. Surgery was completed on 1/21. Pt is unable to participate in d/c planning due to medical / cognitive status. CSW spoke with pt's daughter Linda Davila to assist with d/c planning. Daughter would like pt to return to [facility] at d/c. SNF contacted and clinicals provided for review. SNF will re admit once pt is stable for d/c. CSW will continue to follow to assist with d/c planning to SNF.  Employment status:  Retired Forensic scientist:  Medicare PT Recommendations:  Not assessed at this time Information / Referral to community resources:     Patient/Family's Response to care:  Daughter plans for pt to return to [facility] at d/c.  Patient/Family's Understanding of and  Emotional Response to Diagnosis, Current Treatment, and Prognosis: Daughter is aware of pt's medical status and appreciates CSW assistance with d/c planning.  Emotional Assessment Appearance:  Appears stated age Attitude/Demeanor/Rapport:  Other (cooperative) Affect (typically observed):  Calm Orientation:   (disoriented x4 - responses to voice / pain.) Alcohol / Substance use:  Not Applicable Psych involvement (Current and /or in the community):  No (Comment)  Discharge Needs  Concerns to be addressed:  Discharge Planning Concerns Readmission within the last 30 days:  No Current discharge risk:  None Barriers to Discharge:  No Barriers Identified   Loraine Maple  [id number] 03/01/2016, 10:32 AM

## 2016-03-01 NOTE — Progress Notes (Signed)
PROGRESS NOTE    Linda Davila  [id number] DOB: 07/17/1930 DOA: 02/28/2016 PCP: Viviana Simpler, MD    Brief Narrative: Linda Davila is a 83 y.o. female with medical history significant of normal pressure hydrocephalus and CVA with left sided weakness, not ambulatory, uses wheelchair for mobilization, had a fall at SNF, came in for left hip pain, x rays show acute displaced right femoral sub capital fracture.   Assessment & Plan:   Active Problems:   Closed hip fracture, right, initial encounter ([place])   Acute right displaced hip fracture: Admitted for surgical repair. Orthopedics consulted.  Underwent hemiarthroplasty on the right by Dr Erlinda Hong on 1/21.  Pain control and start PT in am as per orthopedics. SNF on discharge.    Hypothyroidism: resume synthroid.    Hypertension: well controlled.   Hypokalemia: repleted repeat in am is normal.    Anemia: baseline hemoglobin around 12. Dropped to 9.9 today. Suspect anemia of blood loss from surgery.  Monitor. Transfuse to keep hemoglobin greater than 7.   Leukocytosis: reactive. Post surgery.  UA is negative. Monitor.       DVT prophylaxis: (Lovenox/) Code Status: dnr. ) Family Communication: daughter at bedside.  Disposition Plan: pending further eval.    Consultants:   Orthopedics Dr Erlinda Hong.    Procedures: right hemi arthroplasty.    Antimicrobials: pre op cefazolin.    Subjective: Appears comfortable.   Objective: Vitals:   03/01/16 0548 03/01/16 0812 03/01/16 0959 03/01/16 1311  BP: (!) 104/51 (!) 115/57 (!) 104/51 (!) 107/53  Pulse: 88 91 99 92  Resp: 19  20 18   Temp: 98.4 F (36.9 C)  98.2 F (36.8 C) 98 F (36.7 C)  TempSrc: Oral  Axillary Oral  SpO2: 98%  97% 98%    Intake/Output Summary (Last 24 hours) at 03/01/16 1650 Last data filed at 03/01/16 1407  Gross per 24 hour  Intake          2367.92 ml  Output              551 ml  Net          1816.92 ml   There were no vitals filed for this  visit.  Examination:  General exam: Appears calm and comfortable  Respiratory system: Clear to auscultation. Respiratory effort normal. Cardiovascular system: S1 & S2 heard, RRR. No JVD, murmurs, rubs, gallops or clicks. No pedal edema. Gastrointestinal system: Abdomen is nondistended, soft and nontender. No organomegaly or masses felt. Normal bowel sounds heard. Central nervous system: Alert , comfortable.  Extremities: right lower extremity tender movements. Upper extremity contracture.  Skin: No rashes, lesions or ulcers     Data Reviewed: I have personally reviewed following labs and imaging studies  CBC:  Recent Labs Lab 02/28/16 1246 02/29/16 0434 03/01/16 0428  WBC 9.2 8.6 12.3*  HGB 12.7 11.5* 9.9*  HCT 36.7 35.1* 29.2*  MCV 91.8 92.6 93.0  PLT 366 354 [id number]   Basic Metabolic Panel:  Recent Labs Lab 02/28/16 1246 02/29/16 0434 03/01/16 0428  NA  --  141 140  K  --  3.2* 3.9  CL  --  106 110  CO2  --  29 25  GLUCOSE  --  111* 111*  BUN  --  15 15  CREATININE 0.54 0.55 0.52  CALCIUM  --  8.0* 7.5*   GFR: CrCl cannot be calculated (Unknown ideal weight.). Liver Function Tests:  Recent Labs Lab 02/29/16 0434  AST 18  ALT  16  ALKPHOS 39  BILITOT 0.7  PROT 5.5*  ALBUMIN 2.7*   No results for input(s): LIPASE, AMYLASE in the last 168 hours. No results for input(s): AMMONIA in the last 168 hours. Coagulation Profile: No results for input(s): INR, PROTIME in the last 168 hours. Cardiac Enzymes: No results for input(s): CKTOTAL, CKMB, CKMBINDEX, TROPONINI in the last 168 hours. BNP (last 3 results) No results for input(s): PROBNP in the last 8760 hours. HbA1C: No results for input(s): HGBA1C in the last 72 hours. CBG: No results for input(s): GLUCAP in the last 168 hours. Lipid Profile: No results for input(s): CHOL, HDL, LDLCALC, TRIG, CHOLHDL, LDLDIRECT in the last 72 hours. Thyroid Function Tests: No results for input(s): TSH, T4TOTAL, FREET4,  T3FREE, THYROIDAB in the last 72 hours. Anemia Panel: No results for input(s): VITAMINB12, FOLATE, FERRITIN, TIBC, IRON, RETICCTPCT in the last 72 hours. Sepsis Labs: No results for input(s): PROCALCITON, LATICACIDVEN in the last 168 hours.  Recent Results (from the past 240 hour(s))  Surgical pcr screen     Status: Abnormal   Collection Time: 02/28/16  4:30 PM  Result Value Ref Range Status   MRSA, PCR NEGATIVE NEGATIVE Final   Staphylococcus aureus POSITIVE (A) NEGATIVE Final    Comment:        The Xpert SA Assay (FDA approved for NASAL specimens in patients over 71 years of age), is one component of a comprehensive surveillance program.  Test performance has been validated by [facility] for patients greater than or equal to 89 year old. It is not intended to diagnose infection nor to guide or monitor treatment.          Radiology Studies: Pelvis Portable  Result Date: 02/29/2016 CLINICAL DATA:  Total hip replacement. EXAM: PORTABLE PELVIS 1-2 VIEWS COMPARISON:  February 28, 2016 FINDINGS: New right hip replacement. Hardware is in good position. Postoperative skin staples and soft tissue air identified. Some sort of tubing overlies the lower pelvis IMPRESSION: Status post right hip replacement. Electronically Signed   By: Dorise Bullion III M.D   On: 02/29/2016 11:43   Dg C-arm 1-60 Min-no Report  Result Date: 02/29/2016 There is no Radiologist interpretation  for this exam.  Dg Hip Operative Unilat W Or W/o Pelvis Right  Result Date: 02/29/2016 CLINICAL DATA:  Right hip replacement EXAM: OPERATIVE RIGHT HIP (WITH PELVIS IF PERFORMED) 3 VIEWS TECHNIQUE: Fluoroscopic spot image(s) were submitted for interpretation post-operatively. COMPARISON:  None FLUOROSCOPY TIME:  15 seconds 1.09 mGy FINDINGS: Interval right hip arthroplasty without failure or complication. No fracture or dislocation. IMPRESSION: Interval right hip arthroplasty. Electronically Signed   By: Kathreen Devoid    On: 02/29/2016 10:34        Scheduled Meds: . Chlorhexidine Gluconate Cloth  6 each Topical Daily  . cholecalciferol  1,000 Units Oral q morning - 10a  . docusate sodium  100 mg Oral BID  . enoxaparin (LOVENOX) injection  40 mg Subcutaneous Q24H  . latanoprost  1 drop Both Eyes QHS  . levothyroxine  100 mcg Oral QAC breakfast  . lisinopril  20 mg Oral q morning - 10a  . mupirocin ointment  1 application Nasal BID  . pravastatin  20 mg Oral q1800  . senna  1 tablet Oral Daily  . timolol  1 drop Right Eye BID   Continuous Infusions: . sodium chloride 125 mL/hr at 03/01/16 1300     LOS: 2 days    Time spent: 30 minutes.  Hosie Poisson, MD [facility] Pager [phone]  If 7PM-7AM, please contact night-coverage [email or web address] Password [facility] 03/01/2016, 4:50 PM

## 2016-03-02 LAB — BASIC METABOLIC PANEL
ANION GAP: 5 (ref 5–15)
BUN: 13 mg/dL (ref 6–20)
CO2: 23 mmol/L (ref 22–32)
Calcium: 7.2 mg/dL — ABNORMAL LOW (ref 8.9–10.3)
Chloride: 112 mmol/L — ABNORMAL HIGH (ref 101–111)
Creatinine, Ser: 0.39 mg/dL — ABNORMAL LOW (ref 0.44–1.00)
GFR calc Af Amer: >60 mL/min (ref >60)
GLUCOSE: 89 mg/dL (ref 65–99)
POTASSIUM: 3.4 mmol/L — AB (ref 3.5–5.1)
Sodium: 140 mmol/L (ref 135–145)

## 2016-03-02 LAB — CBC
HEMATOCRIT: 26.4 % — AB (ref 36.0–46.0)
Hemoglobin: 8.7 g/dL — ABNORMAL LOW (ref 12.0–15.0)
MCH: 30.2 pg (ref 26.0–34.0)
MCHC: 33 g/dL (ref 30.0–36.0)
MCV: 91.7 fL (ref 78.0–100.0)
Platelets: 250 10*3/uL (ref 150–400)
RBC: 2.88 MIL/uL — AB (ref 3.87–5.11)
RDW: 13.4 % (ref 11.5–15.5)
WBC: 12.7 10*3/uL — ABNORMAL HIGH (ref 4.0–10.5)

## 2016-03-02 MED ORDER — POTASSIUM CHLORIDE CRYS ER 20 MEQ PO TBCR
40.0000 meq | EXTENDED_RELEASE_TABLET | Freq: Once | ORAL | Status: AC
  Administered 2016-03-02: 12:00:00 40 meq via ORAL
  Filled 2016-03-02: qty 2

## 2016-03-02 MED ORDER — LATANOPROST 0.005 % OP SOLN: 1.0000 [drp] | Freq: Every day | OPHTHALMIC | 12 refills | Status: AC

## 2016-03-02 NOTE — Discharge Summary (Signed)
Physician Discharge Summary  Linda Davila [id number] DOB: [birth year] DOA: 02/28/2016  PCP: Viviana Simpler, MD  Admit date: 02/28/2016 Discharge date: 03/02/2016  Admitted From: SNF. Disposition:  SNF  Recommendations for Outpatient Follow-up:  1. Follow up with PCP in 1-2 weeks 2. Please obtain BMP/CBC in one week 3. Please follow UP WITH orthopedics as recommended.     Discharge Condition: guarded.  CODE STATUS: DNR.  Diet recommendation: DYSPHAGIA 3 HONEY THICK LIQUID.   Brief/Interim Summary: Linda H Shermanis a 83 y.o.femalewith medical history significant of normal pressure hydrocephalus and CVA with left sided weakness, not ambulatory, uses wheelchair for mobilization, had a fall at SNF, came in for left hip pain, x rays show acute displaced right femoral sub capital fracture.   Discharge Diagnoses:  Active Problems:   Closed hip fracture, right, initial encounter ([place])   Acute right displaced hip fracture: Admitted for surgical repair. Orthopedics consulted.  Underwent hemiarthroplasty on the right by Dr Erlinda Hong on 1/21.  Pain control and start PT in am as per orthopedics. SNF on discharge.    Hypothyroidism: resume synthroid.    Hypertension: well controlled.   Hypokalemia: repleted repeat in one week.   Anemia: baseline hemoglobin around 12. Dropped to 8.7 today. Suspect anemia of blood loss from surgery.  Monitor.   Leukocytosis: reactive. Post surgery.  UA is negative. Monitor.  Follow up with WBC in one week.   Discharge Instructions  Discharge Instructions    Discharge instructions    Complete by:  As directed    Please follow up with Dr Erlinda Hong as recommended.  Please follow up with PCP in one week.  Please check CBC in one week.   Weight bearing as tolerated    Complete by:  As directed      Allergies as of 03/02/2016      Reactions   Other    nuts      Medication List    STOP taking these medications   bimatoprost 0.03 %  ophthalmic solution Commonly known as:  LUMIGAN Replaced by:  latanoprost 0.005 % ophthalmic solution     TAKE these medications   acetaminophen 325 MG tablet Commonly known as:  TYLENOL Take 325 mg by mouth every 4 (four) hours as needed for mild pain.   alum & mag hydroxide-simeth 200-200-20 MG/5ML suspension Commonly known as:  MAALOX/MYLANTA Take 30 mLs by mouth every 6 (six) hours as needed for indigestion or heartburn.   aspirin EC 81 MG tablet Take 81 mg by mouth every morning.   cetirizine 10 MG tablet Commonly known as:  ZYRTEC Take 10 mg by mouth daily.   docusate sodium 100 MG capsule Commonly known as:  COLACE Take 100 mg by mouth 2 (two) times daily.   enoxaparin 40 MG/0.4ML injection Commonly known as:  LOVENOX Inject 0.4 mLs (40 mg total) into the skin daily.   HYDROcodone-acetaminophen 5-325 MG tablet Commonly known as:  NORCO Take 1-2 tablets by mouth every 6 (six) hours as needed.   latanoprost 0.005 % ophthalmic solution Commonly known as:  XALATAN Place 1 drop into both eyes at bedtime. Replaces:  bimatoprost 0.03 % ophthalmic solution   levothyroxine 100 MCG tablet Commonly known as:  SYNTHROID, LEVOTHROID Take 100 mcg by mouth every morning.   lisinopril 20 MG tablet Commonly known as:  PRINIVIL,ZESTRIL Take 20 mg by mouth every morning.   lovastatin 20 MG tablet Commonly known as:  MEVACOR Take 20 mg by mouth daily.   NUTRITIONAL  DRINK PO Take 1 Dose by mouth 3 (three) times daily.   polyethylene glycol packet Commonly known as:  MIRALAX / GLYCOLAX Take 17 g by mouth daily as needed for mild constipation.   PSYLLIUM HUSK PO Take 1.04 g by mouth at bedtime.   senna 8.6 MG tablet Commonly known as:  SENOKOT Take 1 tablet by mouth daily.   timolol 0.25 % ophthalmic solution Commonly known as:  BETIMOL Place 1 drop into the right eye 2 (two) times daily.   UNABLE TO FIND Take 1 Dose by mouth 3 (three) times daily. Magic Cup    VITAMIN D PO Take 800 Units by mouth every morning.      Follow-up Information    Eduard Roux, MD Follow up in 2 week(s).   Specialty:  Orthopedic Surgery Why:  For suture removal, For wound re-check Contact information: [place] [address] [phone]        Viviana Simpler, MD. Schedule an appointment as soon as possible for a visit in 1 week(s).   Specialties:  Internal Medicine, Pediatrics Contact information: [place] [address] [phone]          Allergies  Allergen Reactions  . Other     nuts    Consultations:  Orthopedics.    Procedures/Studies: Pelvis Portable  Result Date: 02/29/2016 CLINICAL DATA:  Total hip replacement. EXAM: PORTABLE PELVIS 1-2 VIEWS COMPARISON:  February 28, 2016 FINDINGS: New right hip replacement. Hardware is in good position. Postoperative skin staples and soft tissue air identified. Some sort of tubing overlies the lower pelvis IMPRESSION: Status post right hip replacement. Electronically Signed   By: Dorise Bullion III M.D   On: 02/29/2016 11:43   Dg C-arm 1-60 Min-no Report  Result Date: 02/29/2016 There is no Radiologist interpretation  for this exam.  Dg Hip Operative Unilat W Or W/o Pelvis Right  Result Date: 02/29/2016 CLINICAL DATA:  Right hip replacement EXAM: OPERATIVE RIGHT HIP (WITH PELVIS IF PERFORMED) 3 VIEWS TECHNIQUE: Fluoroscopic spot image(s) were submitted for interpretation post-operatively. COMPARISON:  None FLUOROSCOPY TIME:  15 seconds 1.09 mGy FINDINGS: Interval right hip arthroplasty without failure or complication. No fracture or dislocation. IMPRESSION: Interval right hip arthroplasty. Electronically Signed   By: Kathreen Devoid   On: 02/29/2016 10:34   Dg Hip Unilat With Pelvis 2-3 Views Right  Result Date: 02/28/2016 CLINICAL DATA:  Right hip pain EXAM: DG HIP (WITH OR WITHOUT PELVIS) 2-3V RIGHT COMPARISON:  None. FINDINGS: Severe osteopenia.  Right femoral neck fracture with 2.8 cm of superior displacement. No hip dislocation. No other fracture or dislocation. IMPRESSION: Acute right femoral neck fracture with 2.8 cm of superior displacement. Electronically Signed   By: Kathreen Devoid   On: 02/28/2016 12:04       Subjective:  Non verbal.  Discharge Exam: Vitals:   03/02/16 0517 03/02/16 0940  BP: 116/71 (!) 148/75  Pulse: 85 100  Resp: 20 18  Temp: 99.1 F (37.3 C) 98.6 F (37 C)   Vitals:   03/01/16 1311 03/02/16 0220 03/02/16 0517 03/02/16 0940  BP: (!) 107/53 120/64 116/71 (!) 148/75  Pulse: 92 91 85 100  Resp: 18 20 20 18   Temp: 98 F (36.7 C) 98.5 F (36.9 C) 99.1 F (37.3 C) 98.6 F (37 C)  TempSrc: Oral Oral Axillary Oral  SpO2: 98% 95% 96% 96%    General: Pt is sleeping.  Cardiovascular: RRR, S1/S2 +, no rubs, no gallops Respiratory:  CTA bilaterally, no wheezing, no rhonchi Abdominal: Soft, NT, ND, bowel sounds + Extremities: contracted.     The results of significant diagnostics from this hospitalization (including imaging, microbiology, ancillary and laboratory) are listed below for reference.     Microbiology: Recent Results (from the past 240 hour(s))  Surgical pcr screen     Status: Abnormal   Collection Time: 02/28/16  4:30 PM  Result Value Ref Range Status   MRSA, PCR NEGATIVE NEGATIVE Final   Staphylococcus aureus POSITIVE (A) NEGATIVE Final    Comment:        The Xpert SA Assay (FDA approved for NASAL specimens in patients over 37 years of age), is one component of a comprehensive surveillance program.  Test performance has been validated by [facility] for patients greater than or equal to 21 year old. It is not intended to diagnose infection nor to guide or monitor treatment.      Labs: BNP (last 3 results) No results for input(s): BNP in the last 8760 hours. Basic Metabolic Panel:  Recent Labs Lab 02/28/16 1246 02/29/16 0434 03/01/16 0428 03/02/16 0425  NA  --   141 140 140  K  --  3.2* 3.9 3.4*  CL  --  106 110 112*  CO2  --  29 25 23   GLUCOSE  --  111* 111* 89  BUN  --  15 15 13   CREATININE 0.54 0.55 0.52 0.39*  CALCIUM  --  8.0* 7.5* 7.2*   Liver Function Tests:  Recent Labs Lab 02/29/16 0434  AST 18  ALT 16  ALKPHOS 39  BILITOT 0.7  PROT 5.5*  ALBUMIN 2.7*   No results for input(s): LIPASE, AMYLASE in the last 168 hours. No results for input(s): AMMONIA in the last 168 hours. CBC:  Recent Labs Lab 02/28/16 1246 02/29/16 0434 03/01/16 0428 03/02/16 0425  WBC 9.2 8.6 12.3* 12.7*  HGB 12.7 11.5* 9.9* 8.7*  HCT 36.7 35.1* 29.2* 26.4*  MCV 91.8 92.6 93.0 91.7  PLT 366 354 298 250   Cardiac Enzymes: No results for input(s): CKTOTAL, CKMB, CKMBINDEX, TROPONINI in the last 168 hours. BNP: Invalid input(s): POCBNP CBG: No results for input(s): GLUCAP in the last 168 hours. D-Dimer No results for input(s): DDIMER in the last 72 hours. Hgb A1c No results for input(s): HGBA1C in the last 72 hours. Lipid Profile No results for input(s): CHOL, HDL, LDLCALC, TRIG, CHOLHDL, LDLDIRECT in the last 72 hours. Thyroid function studies No results for input(s): TSH, T4TOTAL, T3FREE, THYROIDAB in the last 72 hours.  Invalid input(s): FREET3 Anemia work up No results for input(s): VITAMINB12, FOLATE, FERRITIN, TIBC, IRON, RETICCTPCT in the last 72 hours. Urinalysis    Component Value Date/Time   COLORURINE AMBER (A) 02/28/2016 1055   APPEARANCEUR HAZY (A) 02/28/2016 1055   APPEARANCEUR Hazy 08/25/2013 0945   LABSPEC 1.023 02/28/2016 1055   LABSPEC 1.009 08/25/2013 0945   PHURINE 5.0 02/28/2016 1055   GLUCOSEU NEGATIVE 02/28/2016 1055   GLUCOSEU Negative 08/25/2013 0945   HGBUR SMALL (A) 02/28/2016 1055   BILIRUBINUR NEGATIVE 02/28/2016 1055   BILIRUBINUR Negative 08/25/2013 0945   KETONESUR NEGATIVE 02/28/2016 1055   PROTEINUR NEGATIVE 02/28/2016 1055   UROBILINOGEN 0.2 04/24/2011 1433   NITRITE NEGATIVE 02/28/2016 1055    LEUKOCYTESUR NEGATIVE 02/28/2016 1055   LEUKOCYTESUR Negative 08/25/2013 0945   Sepsis Labs Invalid input(s): PROCALCITONIN,  WBC,  LACTICIDVEN Microbiology Recent Results (from the past 240 hour(s))  Surgical pcr screen     Status:  Abnormal   Collection Time: 02/28/16  4:30 PM  Result Value Ref Range Status   MRSA, PCR NEGATIVE NEGATIVE Final   Staphylococcus aureus POSITIVE (A) NEGATIVE Final    Comment:        The Xpert SA Assay (FDA approved for NASAL specimens in patients over 60 years of age), is one component of a comprehensive surveillance program.  Test performance has been validated by [facility] for patients greater than or equal to 53 year old. It is not intended to diagnose infection nor to guide or monitor treatment.      Time coordinating discharge: Over 30 minutes  SIGNED:   Hosie Poisson, MD  [facility] 03/02/2016, 10:54 AM Pager   If 7PM-7AM, please contact night-coverage [email or web address] Password [email or web address]

## 2016-03-02 NOTE — Progress Notes (Signed)
   Subjective:  Patient minimally responsive to verbal cues.  Objective:   VITALS:   Vitals:   03/01/16 0959 03/01/16 1311 03/02/16 0220 03/02/16 0517  BP: (!) 104/51 (!) 107/53 120/64 116/71  Pulse: 99 92 91 85  Resp: 20 18 20 20   Temp: 98.2 F (36.8 C) 98 F (36.7 C) 98.5 F (36.9 C) 99.1 F (37.3 C)  TempSrc: Axillary Oral Oral Axillary  SpO2: 97% 98% 95% 96%    Neurologically intact Neurovascular intact Sensation intact distally Intact pulses distally Dorsiflexion/Plantar flexion intact Incision: dressing C/D/I and no drainage No cellulitis present Compartment soft   Lab Results  Component Value Date   WBC 12.7 (H) 03/02/2016   HGB 8.7 (L) 03/02/2016   HCT 26.4 (L) 03/02/2016   MCV 91.7 03/02/2016   PLT 250 03/02/2016     Assessment/Plan:  2 Days Post-Op   - Expected postop acute blood loss anemia - will monitor for symptoms - Up with PT/OT - DVT ppx - SCDs, ambulation, lovenox - WBAT operative extremity - Pain control - Discharge planning - stable from ortho stand point  Eduard Roux 03/02/2016, 7:54 AM [phone]

## 2016-03-02 NOTE — Progress Notes (Signed)
Attempted to contact [facility] facility to provide report. Left voicemail to please return my call.

## 2016-03-02 NOTE — Progress Notes (Signed)
CSW assisting with d/c planning. Pt is ready to return to [facility]  Daughter is in agreement with d/c plan. [facility] transport is needed. Medical necessity form completed. Daughter is aware out of pocket costs may be associated with [facility] transport. D/C summary sent to SNF for review. Scripts included in d/c packet. # for report provided to nsg. NSG will contact  daughter once Corey Harold  is here to trans  [employer] LCSW [phone]

## 2016-03-02 NOTE — Progress Notes (Signed)
Pt has a SNF bed at [facility] if stable for d/c today. CSW will assist with d/c planning to SNF.  Werner Lean LCSW [phone]

## 2016-03-16 ENCOUNTER — Ambulatory Visit (INDEPENDENT_AMBULATORY_CARE_PROVIDER_SITE_OTHER): Payer: Medicare Other

## 2016-03-16 ENCOUNTER — Ambulatory Visit (INDEPENDENT_AMBULATORY_CARE_PROVIDER_SITE_OTHER): Payer: Medicare Other | Admitting: Orthopaedic Surgery

## 2016-03-16 DIAGNOSIS — S72001A Fracture of unspecified part of neck of right femur, initial encounter for closed fracture: Secondary | ICD-10-CM

## 2016-03-16 NOTE — Progress Notes (Signed)
2 week postop visit for right hip hemi.  Nonambulatory at baseline.  Lives at SNF.  Not complaining of hip pain.  xrays are stable.  Staples removed.  Continue with PT.  F/u 6 weeks for repeat ap pelvis and final visit.

## 2016-04-27 ENCOUNTER — Ambulatory Visit (INDEPENDENT_AMBULATORY_CARE_PROVIDER_SITE_OTHER): Payer: Medicare Other | Admitting: Orthopaedic Surgery

## 2016-04-30 ENCOUNTER — Ambulatory Visit (INDEPENDENT_AMBULATORY_CARE_PROVIDER_SITE_OTHER): Payer: Medicare Other | Admitting: Orthopaedic Surgery

## 2016-05-03 ENCOUNTER — Ambulatory Visit (INDEPENDENT_AMBULATORY_CARE_PROVIDER_SITE_OTHER): Payer: Medicare Other

## 2016-05-03 ENCOUNTER — Ambulatory Visit (INDEPENDENT_AMBULATORY_CARE_PROVIDER_SITE_OTHER): Payer: Medicare Other | Admitting: Orthopaedic Surgery

## 2016-05-03 ENCOUNTER — Encounter (INDEPENDENT_AMBULATORY_CARE_PROVIDER_SITE_OTHER): Payer: Self-pay | Admitting: Orthopaedic Surgery

## 2016-05-03 DIAGNOSIS — S72001A Fracture of unspecified part of neck of right femur, initial encounter for closed fracture: Secondary | ICD-10-CM | POA: Diagnosis not present

## 2016-05-03 NOTE — Progress Notes (Signed)
Patient is 2 months status post right hip hemiarthroplasty which she is back at her permanent residence. She is not complaining of any pain. She's been nonambulatory for quite some time. Per her daughter she has essentially returned back to baseline. X-ray show stable partial hip replacement in good alignment. My standpoint she has reached MMI. I'll see her back as needed. They have my card is a dynamic

## 2016-05-10 ENCOUNTER — Ambulatory Visit: Payer: Medicare Other | Admitting: Podiatry

## 2016-09-08 DEATH — deceased

## 2016-09-30 NOTE — Addendum Note (Signed)
Addendum  created 09/30/16 1009 by Roberts Gaudy, MD   Sign clinical note
